# Patient Record
Sex: Female | Born: 1960 | Race: White | Hispanic: No | Marital: Married | Smoking: Never smoker
Health system: Southern US, Community
[De-identification: ages and names within clinical notes are randomized; demographics above are authoritative.]

## PROBLEM LIST (undated history)

## (undated) DIAGNOSIS — M1990 Unspecified osteoarthritis, unspecified site: Secondary | ICD-10-CM

## (undated) DIAGNOSIS — H409 Unspecified glaucoma: Secondary | ICD-10-CM

## (undated) DIAGNOSIS — E559 Vitamin D deficiency, unspecified: Secondary | ICD-10-CM

## (undated) DIAGNOSIS — T8859XA Other complications of anesthesia, initial encounter: Secondary | ICD-10-CM

## (undated) DIAGNOSIS — R112 Nausea with vomiting, unspecified: Secondary | ICD-10-CM

## (undated) DIAGNOSIS — I700 Atherosclerosis of aorta: Secondary | ICD-10-CM

## (undated) DIAGNOSIS — D3500 Benign neoplasm of unspecified adrenal gland: Secondary | ICD-10-CM

## (undated) DIAGNOSIS — E28319 Asymptomatic premature menopause: Secondary | ICD-10-CM

## (undated) DIAGNOSIS — T7840XA Allergy, unspecified, initial encounter: Secondary | ICD-10-CM

## (undated) DIAGNOSIS — Z87442 Personal history of urinary calculi: Secondary | ICD-10-CM

## (undated) DIAGNOSIS — I10 Essential (primary) hypertension: Secondary | ICD-10-CM

## (undated) DIAGNOSIS — K760 Fatty (change of) liver, not elsewhere classified: Secondary | ICD-10-CM

## (undated) DIAGNOSIS — E785 Hyperlipidemia, unspecified: Secondary | ICD-10-CM

## (undated) HISTORY — DX: Vitamin D deficiency, unspecified: E55.9

## (undated) HISTORY — DX: Hyperlipidemia, unspecified: E78.5

## (undated) HISTORY — DX: Atherosclerosis of aorta: I70.0

## (undated) HISTORY — DX: Unspecified osteoarthritis, unspecified site: M19.90

## (undated) HISTORY — PX: WISDOM TOOTH EXTRACTION: SHX21

## (undated) HISTORY — DX: Allergy, unspecified, initial encounter: T78.40XA

## (undated) HISTORY — DX: Personal history of urinary calculi: Z87.442

## (undated) HISTORY — DX: Benign neoplasm of unspecified adrenal gland: D35.00

## (undated) HISTORY — DX: Fatty (change of) liver, not elsewhere classified: K76.0

## (undated) HISTORY — DX: Asymptomatic premature menopause: E28.319

---

## 1997-10-12 ENCOUNTER — Ambulatory Visit (HOSPITAL_COMMUNITY): Admission: RE | Admit: 1997-10-12 | Discharge: 1997-10-12 | Payer: Self-pay | Admitting: Obstetrics and Gynecology

## 1997-11-08 ENCOUNTER — Other Ambulatory Visit: Admission: RE | Admit: 1997-11-08 | Discharge: 1997-11-08 | Payer: Self-pay | Admitting: Obstetrics and Gynecology

## 1998-04-20 ENCOUNTER — Inpatient Hospital Stay (HOSPITAL_COMMUNITY): Admission: AD | Admit: 1998-04-20 | Discharge: 1998-04-23 | Payer: Self-pay | Admitting: Obstetrics and Gynecology

## 1998-05-10 ENCOUNTER — Inpatient Hospital Stay (HOSPITAL_COMMUNITY): Admission: AD | Admit: 1998-05-10 | Discharge: 1998-05-14 | Payer: Self-pay | Admitting: Obstetrics & Gynecology

## 1998-06-24 ENCOUNTER — Other Ambulatory Visit: Admission: RE | Admit: 1998-06-24 | Discharge: 1998-06-24 | Payer: Self-pay | Admitting: Obstetrics and Gynecology

## 1998-07-12 ENCOUNTER — Ambulatory Visit (HOSPITAL_COMMUNITY): Admission: RE | Admit: 1998-07-12 | Discharge: 1998-07-12 | Payer: Self-pay | Admitting: Emergency Medicine

## 1998-07-12 ENCOUNTER — Encounter: Payer: Self-pay | Admitting: Emergency Medicine

## 1998-07-12 ENCOUNTER — Emergency Department (HOSPITAL_COMMUNITY): Admission: EM | Admit: 1998-07-12 | Discharge: 1998-07-12 | Payer: Self-pay | Admitting: Emergency Medicine

## 1998-08-12 HISTORY — PX: CHOLECYSTECTOMY: SHX55

## 1998-08-15 ENCOUNTER — Observation Stay (HOSPITAL_COMMUNITY): Admission: RE | Admit: 1998-08-15 | Discharge: 1998-08-16 | Payer: Self-pay | Admitting: Surgery

## 1999-06-24 ENCOUNTER — Other Ambulatory Visit: Admission: RE | Admit: 1999-06-24 | Discharge: 1999-06-24 | Payer: Self-pay | Admitting: Obstetrics and Gynecology

## 2000-11-04 ENCOUNTER — Other Ambulatory Visit: Admission: RE | Admit: 2000-11-04 | Discharge: 2000-11-04 | Payer: Self-pay | Admitting: Obstetrics and Gynecology

## 2001-12-06 ENCOUNTER — Other Ambulatory Visit: Admission: RE | Admit: 2001-12-06 | Discharge: 2001-12-06 | Payer: Self-pay | Admitting: Obstetrics and Gynecology

## 2002-05-12 ENCOUNTER — Emergency Department (HOSPITAL_COMMUNITY): Admission: EM | Admit: 2002-05-12 | Discharge: 2002-05-12 | Payer: Self-pay | Admitting: Emergency Medicine

## 2003-01-10 ENCOUNTER — Other Ambulatory Visit: Admission: RE | Admit: 2003-01-10 | Discharge: 2003-01-10 | Payer: Self-pay | Admitting: Obstetrics and Gynecology

## 2004-03-18 ENCOUNTER — Other Ambulatory Visit: Admission: RE | Admit: 2004-03-18 | Discharge: 2004-03-18 | Payer: Self-pay | Admitting: Obstetrics and Gynecology

## 2005-07-03 ENCOUNTER — Other Ambulatory Visit: Admission: RE | Admit: 2005-07-03 | Discharge: 2005-07-03 | Payer: Self-pay | Admitting: Obstetrics and Gynecology

## 2005-10-12 ENCOUNTER — Ambulatory Visit: Payer: Self-pay | Admitting: Internal Medicine

## 2006-01-13 ENCOUNTER — Ambulatory Visit: Payer: Self-pay | Admitting: Internal Medicine

## 2006-05-21 ENCOUNTER — Ambulatory Visit: Payer: Self-pay | Admitting: Internal Medicine

## 2006-05-31 ENCOUNTER — Ambulatory Visit: Payer: Self-pay | Admitting: Internal Medicine

## 2006-06-01 ENCOUNTER — Encounter: Payer: Self-pay | Admitting: Internal Medicine

## 2006-06-08 ENCOUNTER — Ambulatory Visit: Payer: Self-pay | Admitting: Internal Medicine

## 2006-11-08 ENCOUNTER — Ambulatory Visit: Payer: Self-pay | Admitting: Internal Medicine

## 2006-11-15 ENCOUNTER — Ambulatory Visit: Payer: Self-pay | Admitting: Internal Medicine

## 2006-11-15 LAB — CONVERTED CEMR LAB
Cholesterol, target level: 200 mg/dL
Cholesterol: 192 mg/dL (ref 0–200)
HDL goal, serum: 40 mg/dL
HDL: 44.8 mg/dL (ref >39.0)
Hgb A1c MFr Bld: 5.1 % (ref 4.6–6.0)
LDL Cholesterol: 116 mg/dL — ABNORMAL HIGH (ref 0–99)
LDL Goal: 160 mg/dL
Total CHOL/HDL Ratio: 4.3
Triglycerides: 155 mg/dL — ABNORMAL HIGH (ref 0–149)
VLDL: 31 mg/dL (ref 0–40)

## 2006-11-18 ENCOUNTER — Encounter: Payer: Self-pay | Admitting: Internal Medicine

## 2006-12-12 ENCOUNTER — Emergency Department (HOSPITAL_COMMUNITY): Admission: EM | Admit: 2006-12-12 | Discharge: 2006-12-13 | Payer: Self-pay | Admitting: Emergency Medicine

## 2006-12-15 ENCOUNTER — Encounter: Payer: Self-pay | Admitting: Internal Medicine

## 2006-12-30 ENCOUNTER — Encounter: Payer: Self-pay | Admitting: Internal Medicine

## 2008-01-05 ENCOUNTER — Ambulatory Visit: Payer: Self-pay | Admitting: Internal Medicine

## 2008-01-05 DIAGNOSIS — Z87442 Personal history of urinary calculi: Secondary | ICD-10-CM

## 2008-01-05 DIAGNOSIS — R9431 Abnormal electrocardiogram [ECG] [EKG]: Secondary | ICD-10-CM

## 2008-01-05 DIAGNOSIS — I10 Essential (primary) hypertension: Secondary | ICD-10-CM | POA: Insufficient documentation

## 2008-01-05 DIAGNOSIS — E785 Hyperlipidemia, unspecified: Secondary | ICD-10-CM

## 2008-01-05 DIAGNOSIS — R5381 Other malaise: Secondary | ICD-10-CM

## 2008-01-05 DIAGNOSIS — R5383 Other fatigue: Secondary | ICD-10-CM

## 2008-01-05 DIAGNOSIS — N959 Unspecified menopausal and perimenopausal disorder: Secondary | ICD-10-CM | POA: Insufficient documentation

## 2008-01-13 ENCOUNTER — Ambulatory Visit: Payer: Self-pay | Admitting: Internal Medicine

## 2008-01-24 ENCOUNTER — Encounter (INDEPENDENT_AMBULATORY_CARE_PROVIDER_SITE_OTHER): Payer: Self-pay | Admitting: *Deleted

## 2008-01-24 LAB — CONVERTED CEMR LAB
ALT: 45 units/L — ABNORMAL HIGH (ref 0–35)
AST: 24 units/L (ref 0–37)
Albumin: 3.9 g/dL (ref 3.5–5.2)
Alkaline Phosphatase: 71 units/L (ref 39–117)
BUN: 9 mg/dL (ref 6–23)
Basophils Absolute: 0 10*3/uL (ref 0.0–0.1)
Basophils Relative: 0.8 % (ref 0.0–3.0)
Bilirubin, Direct: 0.1 mg/dL (ref 0.0–0.3)
CO2: 29 meq/L (ref 19–32)
Calcium: 8.6 mg/dL (ref 8.4–10.5)
Chloride: 102 meq/L (ref 96–112)
Cholesterol: 159 mg/dL (ref 0–200)
Creatinine, Ser: 0.9 mg/dL (ref 0.4–1.2)
Eosinophils Absolute: 0.1 10*3/uL (ref 0.0–0.7)
Eosinophils Relative: 2.7 % (ref 0.0–5.0)
GFR calc Af Amer: 86 mL/min
GFR calc non Af Amer: 71 mL/min
Glucose, Bld: 87 mg/dL (ref 70–99)
HCT: 40.7 % (ref 36.0–46.0)
HDL: 37.2 mg/dL — ABNORMAL LOW (ref >39.0)
Hemoglobin: 13.8 g/dL (ref 12.0–15.0)
LDL Cholesterol: 98 mg/dL (ref 0–99)
Lymphocytes Relative: 50.4 % — ABNORMAL HIGH (ref 12.0–46.0)
MCHC: 33.8 g/dL (ref 30.0–36.0)
MCV: 83.9 fL (ref 78.0–100.0)
Monocytes Absolute: 0.4 10*3/uL (ref 0.1–1.0)
Monocytes Relative: 9.2 % (ref 3.0–12.0)
Neutro Abs: 1.8 10*3/uL (ref 1.4–7.7)
Neutrophils Relative %: 36.9 % — ABNORMAL LOW (ref 43.0–77.0)
Platelets: 218 10*3/uL (ref 150–400)
Potassium: 4.2 meq/L (ref 3.5–5.1)
RBC: 4.85 M/uL (ref 3.87–5.11)
RDW: 11.8 % (ref 11.5–14.6)
Sodium: 138 meq/L (ref 135–145)
TSH: 1.1 microintl units/mL (ref 0.35–5.50)
Total Bilirubin: 0.6 mg/dL (ref 0.3–1.2)
Total CHOL/HDL Ratio: 4.3
Total Protein: 7.4 g/dL (ref 6.0–8.3)
Triglycerides: 118 mg/dL (ref 0–149)
VLDL: 24 mg/dL (ref 0–40)
WBC: 4.8 10*3/uL (ref 4.5–10.5)

## 2008-06-27 ENCOUNTER — Telehealth (INDEPENDENT_AMBULATORY_CARE_PROVIDER_SITE_OTHER): Payer: Self-pay | Admitting: *Deleted

## 2008-10-22 ENCOUNTER — Telehealth (INDEPENDENT_AMBULATORY_CARE_PROVIDER_SITE_OTHER): Payer: Self-pay | Admitting: *Deleted

## 2008-11-13 ENCOUNTER — Ambulatory Visit: Payer: Self-pay | Admitting: Internal Medicine

## 2008-11-13 DIAGNOSIS — R740 Nonspecific elevation of levels of transaminase and lactic acid dehydrogenase [LDH]: Secondary | ICD-10-CM

## 2008-11-13 DIAGNOSIS — IMO0001 Reserved for inherently not codable concepts without codable children: Secondary | ICD-10-CM

## 2008-11-14 ENCOUNTER — Encounter (INDEPENDENT_AMBULATORY_CARE_PROVIDER_SITE_OTHER): Payer: Self-pay | Admitting: *Deleted

## 2008-11-20 ENCOUNTER — Encounter (INDEPENDENT_AMBULATORY_CARE_PROVIDER_SITE_OTHER): Payer: Self-pay | Admitting: *Deleted

## 2008-11-21 ENCOUNTER — Telehealth (INDEPENDENT_AMBULATORY_CARE_PROVIDER_SITE_OTHER): Payer: Self-pay | Admitting: *Deleted

## 2010-01-15 LAB — HM MAMMOGRAPHY

## 2010-01-15 LAB — HM PAP SMEAR

## 2010-05-11 LAB — CONVERTED CEMR LAB
ALT: 55 units/L — ABNORMAL HIGH (ref 0–35)
AST: 31 units/L (ref 0–37)
BUN: 11 mg/dL (ref 6–23)
BUN: 9 mg/dL (ref 6–23)
Cholesterol, target level: 200 mg/dL
Creatinine, Ser: 0.8 mg/dL (ref 0.4–1.2)
Creatinine, Ser: 0.9 mg/dL (ref 0.4–1.2)
Free T4: 0.7 ng/dL (ref 0.6–1.6)
Glucose, Bld: 96 mg/dL (ref 70–99)
HDL goal, serum: 50 mg/dL
Hgb A1c MFr Bld: 5.4 % (ref 4.6–6.5)
LDL Goal: 110 mg/dL
Potassium: 4.1 meq/L (ref 3.5–5.1)
Potassium: 4.3 meq/L (ref 3.5–5.1)
TSH: 1.66 microintl units/mL (ref 0.35–5.50)

## 2010-08-29 NOTE — Op Note (Signed)
[facility]  Patient:    Taylor Holden Visit Number: [id number] MRN: [id number]          Service Type: OBS Location: [id number] Attending Physician:  Earlie Server Proc. Date: 05/11/98 Admit Date:  05/10/1998                             Operative Report  PREOPERATIVE DIAGNOSIS:       Intrauterine pregnancy at 37 weeks.  Spontaneous rupture of membranes.  Subsequent failure to descend.  POSTOPERATIVE DIAGNOSIS:      Intrauterine pregnancy at 37 weeks.  Spontaneous rupture of membranes.  Subsequent failure to descend.  OPERATION:                    Low transverse cesarean section.  SURGEON:                      Darlyn Chamber, M.D.  ASSISTANT:  ANESTHESIA:                   Epidural.  ESTIMATED BLOOD LOSS:         800 cc.  PACKS AND DRAINS:             None.  BLOOD REPLACED:               None.  COMPLICATIONS:                None.  INDICATIONS:                  A 50 year old primigravida married white female presents at 37 weeks with spontaneous rupture of membranes.  Progressed to complete dilatation.  After 2-1/2 hours of pushing, the fetal vertex remained arrested at -1 to 0 station with increasing caput.  It was felt that the outlet was too narrow and the vertex too elevated for proper application of vacuum extraction or forceps.  Therefore, we decided to proceed with primary cesarean section.  The risks were  discussed including the risks of infection, the risk of hemorrhage that could necessitate transfusion and associated risk of AIDS or hepatitis, risk of injury to adjacent organs including bladder, bowel, or ureters that could require further  exploratory surgery, risk of deep venous thrombosis and pulmonary embolism. The patient expressed understanding of indications and risks.  DESCRIPTION OF PROCEDURE:     The patient was taken to the OR and placed in the  supine position with left lateral tilt.  After  satisfactory epidural anesthesia was obtained, the abdomen was prepped with Betadine and draped as a sterile field. A low transverse skin incision was made with the knife and carried through the subcutaneous tissue.  The anterior rectus fascia entered sharply and incision to the fascia extended laterally.  The fascia was taken off of the muscles superiorly and inferiorly using blunt and sharp dissection.  The rectus muscles were separated in the midline.  The peritoneum was entered sharply and incision to peritoneum extended both superiorly and inferiorly.  A low transverse bladder flap was developed.  A low transverse uterine incision was begun with the knife and extended laterally using manual traction.  The infant presented in the vertex presentation and delivered with elevation of the head and fundal pressure.  The infant was a  viable female weighing 6 pounds 5 ounces, Apgars were 8 over 9.  Umbilical artery pH was 0.17.  The placenta was then delivered manually.  The uterus was wiped free of remaining membranes and placenta.  The uterus was closed with a running locking  suture of 0 chromic using a two layer closure technique.  Good hemostasis was noted.  The tubes and ovaries were visualized and noted to be unremarkable. Blood-tinged urine was noted.  We therefore retrofilled the bladder with sterilized milk.  There was no evidence of any leakage and urine was clearing after this. The pelvic cavity was thoroughly irrigated.  Hemostasis was excellent.  Muscles reapproximated with running suture of 3-0 chromic.  The fascia was closed with  running suture of 0 PDS.  The skin was closed with staples and Steri-Strips. Sponge, needle, and instrument counts were correct by circulating nurse x 2. Foley catheter was clear at the time of closure.  The patient tolerated the procedure  well and was returned to the recovery room in good condition. Attending Physician:  Earlie Server DD:  05/11/98 TD:  05/12/98 Job: [id number]

## 2011-01-23 LAB — CBC
HCT: 42.3
MCHC: 33.7
MCV: 83.2
Platelets: 268
RBC: 5.09
WBC: 8.5

## 2011-01-23 LAB — URINALYSIS, ROUTINE W REFLEX MICROSCOPIC
Bilirubin Urine: NEGATIVE
Glucose, UA: NEGATIVE
Ketones, ur: NEGATIVE
Leukocytes, UA: NEGATIVE
Nitrite: NEGATIVE
Protein, ur: NEGATIVE
Specific Gravity, Urine: 1.014
Urobilinogen, UA: 0.2
pH: 8.5 — ABNORMAL HIGH

## 2011-01-23 LAB — DIFFERENTIAL
Basophils Absolute: 0
Basophils Relative: 1
Eosinophils Absolute: 0.1
Eosinophils Relative: 1
Lymphocytes Relative: 19
Lymphs Abs: 1.6
Monocytes Absolute: 0.6
Monocytes Relative: 7
Neutro Abs: 6.2
Neutrophils Relative %: 73

## 2011-01-23 LAB — I-STAT 8, (EC8 V) (CONVERTED LAB)
BUN: 14
Bicarbonate: 29.3 — ABNORMAL HIGH
Glucose, Bld: 126 — ABNORMAL HIGH
TCO2: 31
pCO2, Ven: 44.5 — ABNORMAL LOW

## 2011-01-23 LAB — URINE MICROSCOPIC-ADD ON

## 2011-01-23 LAB — POCT I-STAT CREATININE: Creatinine, Ser: 1.1

## 2011-01-23 LAB — PREGNANCY, URINE: Preg Test, Ur: NEGATIVE

## 2012-09-17 DIAGNOSIS — H409 Unspecified glaucoma: Secondary | ICD-10-CM | POA: Insufficient documentation

## 2012-09-17 DIAGNOSIS — E559 Vitamin D deficiency, unspecified: Secondary | ICD-10-CM | POA: Insufficient documentation

## 2014-01-11 HISTORY — PX: EYE SURGERY: SHX253

## 2014-10-09 ENCOUNTER — Emergency Department (HOSPITAL_BASED_OUTPATIENT_CLINIC_OR_DEPARTMENT_OTHER)
Admission: EM | Admit: 2014-10-09 | Discharge: 2014-10-09 | Disposition: A | Discharge status: Home / Self Care | Payer: BC Managed Care – PPO | Attending: Emergency Medicine | Admitting: Emergency Medicine

## 2014-10-09 ENCOUNTER — Emergency Department (HOSPITAL_BASED_OUTPATIENT_CLINIC_OR_DEPARTMENT_OTHER): Payer: BC Managed Care – PPO

## 2014-10-09 DIAGNOSIS — R110 Nausea: Secondary | ICD-10-CM | POA: Diagnosis not present

## 2014-10-09 DIAGNOSIS — R109 Unspecified abdominal pain: Secondary | ICD-10-CM | POA: Diagnosis not present

## 2014-10-09 DIAGNOSIS — R1031 Right lower quadrant pain: Secondary | ICD-10-CM | POA: Diagnosis present

## 2014-10-09 DIAGNOSIS — R6883 Chills (without fever): Secondary | ICD-10-CM | POA: Insufficient documentation

## 2014-10-09 DIAGNOSIS — M79651 Pain in right thigh: Secondary | ICD-10-CM | POA: Insufficient documentation

## 2014-10-09 DIAGNOSIS — Z3202 Encounter for pregnancy test, result negative: Secondary | ICD-10-CM | POA: Insufficient documentation

## 2014-10-09 DIAGNOSIS — Z79899 Other long term (current) drug therapy: Secondary | ICD-10-CM | POA: Insufficient documentation

## 2014-10-09 DIAGNOSIS — Z87442 Personal history of urinary calculi: Secondary | ICD-10-CM | POA: Insufficient documentation

## 2014-10-09 DIAGNOSIS — M79604 Pain in right leg: Secondary | ICD-10-CM

## 2014-10-09 LAB — BASIC METABOLIC PANEL
ANION GAP: 9 (ref 5–15)
BUN: 14 mg/dL (ref 6–20)
CALCIUM: 9.7 mg/dL (ref 8.9–10.3)
CHLORIDE: 102 mmol/L (ref 101–111)
CO2: 29 mmol/L (ref 22–32)
CREATININE: 0.93 mg/dL (ref 0.44–1.00)
Glucose, Bld: 102 mg/dL — ABNORMAL HIGH (ref 65–99)
Potassium: 4.2 mmol/L (ref 3.5–5.1)
Sodium: 140 mmol/L (ref 135–145)

## 2014-10-09 LAB — CBC WITH DIFFERENTIAL/PLATELET
BASOS ABS: 0 10*3/uL (ref 0.0–0.1)
BASOS PCT: 1 % (ref 0–1)
EOS ABS: 0.2 10*3/uL (ref 0.0–0.7)
Eosinophils Relative: 3 % (ref 0–5)
HEMATOCRIT: 45.2 % (ref 36.0–46.0)
HEMOGLOBIN: 14.9 g/dL (ref 12.0–15.0)
LYMPHS PCT: 30 % (ref 12–46)
Lymphs Abs: 2.6 10*3/uL (ref 0.7–4.0)
MCH: 28.2 pg (ref 26.0–34.0)
MCHC: 33 g/dL (ref 30.0–36.0)
MCV: 85.4 fL (ref 78.0–100.0)
MONO ABS: 1 10*3/uL (ref 0.1–1.0)
MONOS PCT: 12 % (ref 3–12)
NEUTROS ABS: 4.9 10*3/uL (ref 1.7–7.7)
NEUTROS PCT: 56 % (ref 43–77)
Platelets: 261 10*3/uL (ref 150–400)
RBC: 5.29 MIL/uL — AB (ref 3.87–5.11)
RDW: 13.4 % (ref 11.5–15.5)
WBC: 8.8 10*3/uL (ref 4.0–10.5)

## 2014-10-09 LAB — URINALYSIS, ROUTINE W REFLEX MICROSCOPIC
BILIRUBIN URINE: NEGATIVE
GLUCOSE, UA: NEGATIVE mg/dL
HGB URINE DIPSTICK: NEGATIVE
KETONES UR: NEGATIVE mg/dL
Leukocytes, UA: NEGATIVE
Nitrite: NEGATIVE
PH: 5.5 (ref 5.0–8.0)
PROTEIN: NEGATIVE mg/dL
SPECIFIC GRAVITY, URINE: 1.006 (ref 1.005–1.030)
UROBILINOGEN UA: 0.2 mg/dL (ref 0.0–1.0)

## 2014-10-09 LAB — PREGNANCY, URINE: Preg Test, Ur: NEGATIVE

## 2014-10-09 MED ORDER — ONDANSETRON HCL 4 MG/2ML IJ SOLN
4.0000 mg | Freq: Once | INTRAMUSCULAR | Status: AC
  Administered 2014-10-09: 4 mg via INTRAVENOUS
  Filled 2014-10-09: qty 2

## 2014-10-09 MED ORDER — MORPHINE SULFATE 4 MG/ML IJ SOLN
4.0000 mg | Freq: Once | INTRAMUSCULAR | Status: AC
  Administered 2014-10-09: 4 mg via INTRAVENOUS
  Filled 2014-10-09: qty 1

## 2014-10-09 NOTE — ED Notes (Signed)
Patient transported to CT 

## 2014-10-09 NOTE — ED Provider Notes (Signed)
CSN: [id number]     Arrival date & time 10/09/14  1617 History   First MD Initiated Contact with Patient 10/09/14 1625     Chief Complaint  Patient presents with  . Groin Pain     (Consider location/radiation/quality/duration/timing/severity/associated sxs/prior Treatment) Patient is a 54 y.o. female presenting with abdominal pain.  Abdominal Pain Pain location:  R flank (right low back, right inguinal region, right thigh) Pain quality: aching   Pain severity:  Moderate Onset quality:  Gradual Duration:  2 weeks Timing:  Constant Progression:  Worsening Chronicity:  Recurrent Context comment:  Several years ago, had similar symptoms attributed to CT visualized ureteral stone.  Relieved by: heating pad. Exacerbated by: certain movements. Ineffective treatments:  Acetaminophen and NSAIDs Associated symptoms: chills and nausea   Associated symptoms: no chest pain, no dysuria, no fever, no hematuria, no shortness of breath and no vomiting   Associated symptoms comment:  No numbness, tingling, weakness   No past medical history on file. No past surgical history on file. No family history on file. History  Substance Use Topics  . Smoking status: Not on file  . Smokeless tobacco: Not on file  . Alcohol Use: Not on file   OB History    No data available     Review of Systems  Constitutional: Positive for chills. Negative for fever.  Respiratory: Negative for shortness of breath.   Cardiovascular: Negative for chest pain.  Gastrointestinal: Positive for nausea and abdominal pain. Negative for vomiting.  Genitourinary: Negative for dysuria and hematuria.  All other systems reviewed and are negative.     Allergies  Cephalexin and Hydrochlorothiazide  Home Medications   Prior to Admission medications   Medication Sig Start Date End Date Taking? Authorizing Provider  metoprolol tartrate (LOPRESSOR) 25 MG tablet Take 25 mg by mouth 2 (two) times daily.   Yes Historical  Provider, MD  valsartan (DIOVAN) 320 MG tablet Take 320 mg by mouth daily.   Yes Historical Provider, MD   BP 143/76 mmHg  Pulse 83  Temp(Src) 97.7 F (36.5 C) (Oral)  Resp 18  Ht 5\' 4"  (1.626 m)  Wt 200 lb (90.719 kg)  BMI 34.31 kg/m2  SpO2 99% Physical Exam  Constitutional: She is oriented to person, place, and time. She appears well-developed and well-nourished. No distress.  HENT:  Head: Normocephalic and atraumatic.  Eyes: Conjunctivae are normal. No scleral icterus.  Neck: Neck supple.  Cardiovascular: Normal rate and intact distal pulses.   Pulmonary/Chest: Effort normal. No stridor. No respiratory distress.  Abdominal: Normal appearance. She exhibits no distension. There is no tenderness. There is no rigidity, no rebound and no guarding.    Musculoskeletal:       Legs: Neurological: She is alert and oriented to person, place, and time.  Skin: Skin is warm and dry. No rash noted.  Psychiatric: She has a normal mood and affect. Her behavior is normal.  Nursing note and vitals reviewed.   ED Course  Procedures (including critical care time) Labs Review Labs Reviewed  URINALYSIS, ROUTINE W REFLEX MICROSCOPIC (NOT AT [facility]) - Abnormal; Notable for the following:    Color, Urine AMBER (*)    All other components within normal limits  CBC WITH DIFFERENTIAL/PLATELET - Abnormal; Notable for the following:    RBC 5.29 (*)    All other components within normal limits  BASIC METABOLIC PANEL - Abnormal; Notable for the following:    Glucose, Bld 102 (*)    All other  components within normal limits  PREGNANCY, URINE    Imaging Review Ct Renal Stone Study  10/09/2014   CLINICAL DATA:  Right lower flank pain.  Diarrhea.  EXAM: CT ABDOMEN AND PELVIS WITHOUT CONTRAST  TECHNIQUE: Multidetector CT imaging of the abdomen and pelvis was performed following the standard protocol without IV contrast.  COMPARISON:  12/13/2006  FINDINGS: Lower chest: Subsegmental atelectasis in the  posterior basal segments of both lower lobes.  Hepatobiliary: Diffuse hepatic steatosis.  Cholecystectomy.  Pancreas: Unremarkable  Spleen: Unremarkable  Adrenals/Urinary Tract: 1.6 by 1.4 cm right adrenal mass, 3 Hounsfield units density, image 28 series 2. No hydronephrosis or hydroureter.  There are calcifications in the immediate vicinity of the right distal ureter but my suspicion is that these are actually outside the ureter.  Stomach/Bowel: Fatty ileocecal valve.  Vascular/Lymphatic: Mild distal abdominal aortic atherosclerotic calcification.  Reproductive: Unremarkable  Other: No supplemental non-categorized findings.  Musculoskeletal: Lumbar spondylosis and degenerative disc disease, resulting an bilateral foraminal impingement at L5-S1. There is suspected mild to moderate central narrowing of the thecal sac at L4-5.  IMPRESSION: 1. Appendix normal. A specific cause for right flank pain not observed. There are some calcifications in the immediate vicinity of the right distal ureter, but my suspicion is that these are outside of the ureter based on lack of conclusive tissue rim sign and the complete lack of hydroureter proximal to these calcifications. 2. Lumbar spondylosis and degenerative disc disease, resulting in impingement at L5-S1 and probably at L4-5. 3. Mild atherosclerosis. 4. Diffuse hepatic steatosis. 5. Small right adrenal adenoma.   Electronically Signed   By: Van Clines M.D.   On: 10/09/2014 19:53     EKG Interpretation None      MDM   Final diagnoses:  Right flank pain  Right leg pain    She feels symptoms similar to kidney stone in the past.    CT negative for stone, but does show some DDD, which could be source of pain.  She has no lower extremity weakness or numbness, no perineal numbness, no bowel or bladder incontinence, no fevers.  Advised she follow up closely.    Serita Grit, MD 10/09/14 [phone]

## 2014-10-09 NOTE — ED Notes (Signed)
Patient here with right groin pain 2 weeks, reports that it started in her flank and has moved to groin. Hx of stones

## 2014-10-09 NOTE — ED Notes (Signed)
Spoke with MD about pts results and dispo plan.  He will see her asap.

## 2015-09-19 ENCOUNTER — Emergency Department (HOSPITAL_BASED_OUTPATIENT_CLINIC_OR_DEPARTMENT_OTHER)
Admission: EM | Admit: 2015-09-19 | Discharge: 2015-09-19 | Disposition: A | Discharge status: Home / Self Care | Payer: BC Managed Care – PPO | Attending: Emergency Medicine | Admitting: Emergency Medicine

## 2015-09-19 ENCOUNTER — Encounter (HOSPITAL_BASED_OUTPATIENT_CLINIC_OR_DEPARTMENT_OTHER): Payer: Self-pay

## 2015-09-19 DIAGNOSIS — Z79899 Other long term (current) drug therapy: Secondary | ICD-10-CM | POA: Insufficient documentation

## 2015-09-19 DIAGNOSIS — I951 Orthostatic hypotension: Secondary | ICD-10-CM | POA: Insufficient documentation

## 2015-09-19 DIAGNOSIS — E785 Hyperlipidemia, unspecified: Secondary | ICD-10-CM | POA: Insufficient documentation

## 2015-09-19 DIAGNOSIS — I10 Essential (primary) hypertension: Secondary | ICD-10-CM | POA: Insufficient documentation

## 2015-09-19 DIAGNOSIS — T50995A Adverse effect of other drugs, medicaments and biological substances, initial encounter: Secondary | ICD-10-CM | POA: Diagnosis not present

## 2015-09-19 DIAGNOSIS — R42 Dizziness and giddiness: Secondary | ICD-10-CM | POA: Diagnosis present

## 2015-09-19 DIAGNOSIS — Y638 Failure in dosage during other surgical and medical care: Secondary | ICD-10-CM | POA: Insufficient documentation

## 2015-09-19 DIAGNOSIS — T50905A Adverse effect of unspecified drugs, medicaments and biological substances, initial encounter: Secondary | ICD-10-CM

## 2015-09-19 HISTORY — DX: Unspecified glaucoma: H40.9

## 2015-09-19 HISTORY — DX: Essential (primary) hypertension: I10

## 2015-09-19 LAB — COMPREHENSIVE METABOLIC PANEL
ALBUMIN: 4.4 g/dL (ref 3.5–5.0)
ALK PHOS: 98 U/L (ref 38–126)
ALT: 63 U/L — ABNORMAL HIGH (ref 14–54)
ANION GAP: 9 (ref 5–15)
AST: 38 U/L (ref 15–41)
BUN: 15 mg/dL (ref 6–20)
CALCIUM: 9.5 mg/dL (ref 8.9–10.3)
CO2: 28 mmol/L (ref 22–32)
CREATININE: 1.09 mg/dL — AB (ref 0.44–1.00)
Chloride: 99 mmol/L — ABNORMAL LOW (ref 101–111)
GFR calc Af Amer: >60 mL/min (ref >60)
GFR calc non Af Amer: 56 mL/min — ABNORMAL LOW (ref >60)
GLUCOSE: 102 mg/dL — AB (ref 65–99)
Potassium: 3.5 mmol/L (ref 3.5–5.1)
SODIUM: 136 mmol/L (ref 135–145)
Total Bilirubin: 0.7 mg/dL (ref 0.3–1.2)
Total Protein: 7.8 g/dL (ref 6.5–8.1)

## 2015-09-19 LAB — CBC WITH DIFFERENTIAL/PLATELET
BASOS PCT: 0 %
Basophils Absolute: 0 10*3/uL (ref 0.0–0.1)
EOS ABS: 0.2 10*3/uL (ref 0.0–0.7)
EOS PCT: 2 %
HCT: 42.2 % (ref 36.0–46.0)
HEMOGLOBIN: 14.2 g/dL (ref 12.0–15.0)
Lymphocytes Relative: 30 %
Lymphs Abs: 2.9 10*3/uL (ref 0.7–4.0)
MCH: 28 pg (ref 26.0–34.0)
MCHC: 33.6 g/dL (ref 30.0–36.0)
MCV: 83.1 fL (ref 78.0–100.0)
MONOS PCT: 10 %
Monocytes Absolute: 1 10*3/uL (ref 0.1–1.0)
NEUTROS PCT: 58 %
Neutro Abs: 5.6 10*3/uL (ref 1.7–7.7)
PLATELETS: 257 10*3/uL (ref 150–400)
RBC: 5.08 MIL/uL (ref 3.87–5.11)
RDW: 13 % (ref 11.5–15.5)
WBC: 9.7 10*3/uL (ref 4.0–10.5)

## 2015-09-19 NOTE — Discharge Instructions (Signed)
Stop taking Maxide (new diuretic) Check and record blood pressures daily. Follow-up with your primary care physician  Hypotension As your heart beats, it forces blood through your body. This force is called blood pressure. If you have hypotension, you have low blood pressure. When your blood pressure is too low, you may not get enough blood to your brain. You may feel weak, feel lightheaded, have a fast heartbeat, or even pass out (faint). HOME CARE  Drink enough fluids to keep your pee (urine) clear or pale yellow.  Take all medicines as told by your doctor.  Get up slowly after sitting or lying down.  Wear support stockings as told by your doctor.  Maintain a healthy diet by including foods such as fruits, vegetables, nuts, whole grains, and lean meats. GET HELP IF:  You are throwing up (vomiting) or have watery poop (diarrhea).  You have a fever for more than 2-3 days.  You feel more thirsty than usual.  You feel weak and tired. GET HELP RIGHT AWAY IF:   You pass out (faint).  You have chest pain or a fast or irregular heartbeat.  You lose feeling in part of your body.  You cannot move your arms or legs.  You have trouble speaking.  You get sweaty or feel lightheaded. MAKE SURE YOU:   Understand these instructions.  Will watch your condition.  Will get help right away if you are not doing well or get worse.   This information is not intended to replace advice given to you by your health care provider. Make sure you discuss any questions you have with your health care provider.   Document Released: 06/24/2009 Document Revised: 11/30/2012 Document Reviewed: 09/30/2012 Elsevier Interactive Patient Education [employer].

## 2015-09-19 NOTE — ED Notes (Signed)
Pt c/o light headed, nausea- x 2-3 days BP was taken at work-BP low "98/70"-PCP increased BP meds 2 weeks ago-NAD-steady gait

## 2015-09-27 ENCOUNTER — Telehealth: Payer: Self-pay | Admitting: Internal Medicine

## 2015-09-27 NOTE — Telephone Encounter (Signed)
Called in to see if she could be established with Dr. Larose Kells as a new pt? She says that her husband and mother in law  Yoanna Jurczyk and Tene Gato are current pt's.   Please advise for scheduling.    CB: Home number on file.

## 2015-09-27 NOTE — Telephone Encounter (Signed)
Yes , please schedule an appointment at the patient's convenience

## 2015-10-01 NOTE — Telephone Encounter (Signed)
Patient scheduled for 10/02/2015

## 2015-10-02 ENCOUNTER — Encounter: Payer: Self-pay | Admitting: Internal Medicine

## 2015-10-02 ENCOUNTER — Ambulatory Visit (INDEPENDENT_AMBULATORY_CARE_PROVIDER_SITE_OTHER): Payer: BC Managed Care – PPO | Admitting: Internal Medicine

## 2015-10-02 VITALS — BP 124/78 | HR 74 | Temp 98.1°F | Ht 64.0 in | Wt 200.4 lb

## 2015-10-02 DIAGNOSIS — I10 Essential (primary) hypertension: Secondary | ICD-10-CM | POA: Diagnosis not present

## 2015-10-02 DIAGNOSIS — R0683 Snoring: Secondary | ICD-10-CM

## 2015-10-02 DIAGNOSIS — E669 Obesity, unspecified: Secondary | ICD-10-CM | POA: Diagnosis not present

## 2015-10-02 MED ORDER — METOPROLOL SUCCINATE ER 50 MG PO TB24: 50.0000 mg | ORAL_TABLET | Freq: Every day | ORAL | Status: DC

## 2015-10-02 NOTE — Patient Instructions (Signed)
  GO TO THE FRONT DESK Schedule your next appointment for a routine checkup in 6 weeks, fasting.        Check the  blood pressure daily Be sure your blood pressure is between 110/65 and  145/85. If it is consistently higher or lower, let me know

## 2015-10-02 NOTE — Progress Notes (Signed)
Pre visit review using our clinic review tool, if applicable. No additional management support is needed unless otherwise documented below in the visit note. 

## 2015-10-02 NOTE — Progress Notes (Signed)
Subjective:    Patient ID: Taylor Holden, female    DOB: [birth year], 55 y.o.   MRN: [id number]  DOS:  10/02/2015 Type of visit - description : New patient, to get established Interval history: In general feeling well, her main concern however is her blood pressure. Went to his previous Dr. 09/02/2015, BP was in the 150/100, Maxide was added. Few days later, she ended up in the emergency room with low blood pressure, I was unable to open the ER visit note. They recommend to take only valsartan. She did, BP was satisfactory but it  started to increase slowly so she restarted metoprolol ER 25 mg daily, despite that the ambulatory BPs continue to increase to the 140s and even 165/92. Denies taking excessive NSAIDs Admits to stress at work. Has gained 5 pounds in the last few months. Admits to snoring, denies fatigue but she feels is sleeping from time to time.   Review of Systems Denies chest pain or difficulty breathing No nausea, vomiting, diarrhea  Past Medical History  Diagnosis Date  . Hypertension   . Glaucoma   . History of kidney stones   . Hyperlipidemia   . Vitamin D deficiency   . Early menopause   . Allergy     Past Surgical History  Procedure Laterality Date  . Cholecystectomy  08/1998  . Cesarean section  04/1998    x1  . Eye surgery Right 01-2014    for glaucoma    Social History   Social History  . Marital Status: Married    Spouse Name: N/A  . Number of Children: 1  . Years of Education: N/A   Occupational History  . elementary school teacher     Social History Main Topics  . Smoking status: Never Smoker   . Smokeless tobacco: Not on file  . Alcohol Use: No     Comment: rare   . Drug Use: No  . Sexual Activity: Not on file   Other Topics Concern  . Not on file   Social History Narrative     Family History  Problem Relation Age of Onset  . Alzheimer's disease Mother   . Diabetes Father   . Hypertension Father   . Stroke Father    aneurysm   . Diabetes Paternal Grandmother   . Heart disease Paternal Grandmother   . Hypertension Paternal Grandmother   . Colon cancer Neg Hx   . Breast cancer Neg Hx        Medication List       This list is accurate as of: 10/02/15 11:59 PM.  Always use your most recent med list.               latanoprost 0.005 % ophthalmic solution  Commonly known as:  XALATAN  Place 1 drop into both eyes at bedtime.     metoprolol succinate 50 MG 24 hr tablet  Commonly known as:  TOPROL-XL  Take 1 tablet (50 mg total) by mouth daily. Take with or immediately following a meal.     valsartan 320 MG tablet  Commonly known as:  DIOVAN  Take 320 mg by mouth daily.           Objective:   Physical Exam BP 124/78 mmHg  Pulse 74  Temp(Src) 98.1 F (36.7 C) (Oral)  Ht 5\' 4"  (1.626 m)  Wt 200 lb 6 oz (90.89 kg)  BMI 34.38 kg/m2  SpO2 98% General:   Well developed, + central  obesity . NAD.  HEENT:  Normocephalic . Face symmetric, atraumatic Lungs:  CTA B Normal respiratory effort, no intercostal retractions, no accessory muscle use. Heart: RRR,  no murmur.  no pretibial edema bilaterally  Abdomen:  Not distended, soft, non-tender. No rebound or rigidity.  Skin: Not pale. Not jaundice Neurologic:  alert & oriented X3.  Speech normal, gait appropriate for age and unassisted Psych--  Cognition and judgment appear intact.  Cooperative with normal attention span and concentration.  Behavior appropriate. No anxious or depressed appearing.    Assessment & Plan:   Assessment HTN -- rx meds 2001; in the past hctz caused low K Hyperlipidemia Glaucoma  Early menopause, onset 2011 Vitamin D deficiency Allergies H/o  kidney stones  PLAN HTN: On valsartan/metoprolol for a while, BP slightly elevated last month, Maxide was added by a MD, went to the ER June 8 with low blood pressure, Maxide DC. Then was on valsartan only, BPs started to increase and she restarted BB, BP lately  still in the 150, 165/92 at home  (although BP today here is  ok). Plan: Continue losartan, increase  Metoprolol from 25 to 50, monitor BPs. healthy diet, increase exercise  Obesity: + wt gain lately, reports room for improvement on her lifestyle, counseled about diet-exercise. Reassess on RTC Snoring: epworth scale  9, borderline +. We will work on  weight loss and reassess in the future Primary care: Colonoscopy 2014 Dr. Cindee Lame Gynecologist Dr. Shanon Brow lowe RTC 6 weeks, fasting   Today, I spent more than  35  min with the patient: >50% of the time counseling regards diet-exercise-risk of OSA and taking a complex HPI

## 2015-10-03 DIAGNOSIS — R0683 Snoring: Secondary | ICD-10-CM | POA: Insufficient documentation

## 2015-10-03 DIAGNOSIS — Z09 Encounter for follow-up examination after completed treatment for conditions other than malignant neoplasm: Secondary | ICD-10-CM | POA: Insufficient documentation

## 2015-10-03 NOTE — Assessment & Plan Note (Signed)
HTN: On valsartan/metoprolol for a while, BP slightly elevated last month, Maxide was added by a MD, went to the ER June 8 with low blood pressure, Maxide DC. Then was on valsartan only, BPs started to increase and she restarted BB, BP lately still in the 150, 165/92 at home  (although BP today here is  ok). Plan: Continue losartan, increase  Metoprolol from 25 to 50, monitor BPs. healthy diet, increase exercise  Obesity: + wt gain lately, reports room for improvement on her lifestyle, counseled about diet-exercise. Reassess on RTC Snoring: epworth scale  9, borderline +. We will work on  weight loss and reassess in the future Primary care: Colonoscopy 2014 Dr. Cindee Lame Gynecologist Dr. Shanon Brow lowe RTC 6 weeks, fasting

## 2015-10-04 NOTE — ED Provider Notes (Signed)
CSN: [id number]     Arrival date & time 09/19/15  1628 History   First MD Initiated Contact with Patient 09/19/15 1702     Chief Complaint  Patient presents with  . Dizziness      HPI  Patient presents evaluation of lightheadedness last 2-3 days. Placed on a higher dose of her blood pressure medication and admitting 2 weeks ago progressive symptoms since that. No sick BP. No headache. No chest pain. Normal urine output. No vomiting diarrhea or dehydration  Past Medical History  Diagnosis Date  . Hypertension   . Glaucoma   . History of kidney stones   . Hyperlipidemia   . Vitamin D deficiency   . Early menopause   . Allergy    Past Surgical History  Procedure Laterality Date  . Cholecystectomy  08/1998  . Cesarean section  04/1998    x1  . Eye surgery Right 01-2014    for glaucoma   Family History  Problem Relation Age of Onset  . Alzheimer's disease Mother   . Diabetes Father   . Hypertension Father   . Stroke Father     aneurysm   . Diabetes Paternal Grandmother   . Heart disease Paternal Grandmother   . Hypertension Paternal Grandmother   . Colon cancer Neg Hx   . Breast cancer Neg Hx    Social History  Substance Use Topics  . Smoking status: Never Smoker   . Smokeless tobacco: None  . Alcohol Use: No     Comment: rare    OB History    No data available     Review of Systems  Constitutional: Negative for fever, chills, diaphoresis, appetite change and fatigue.  HENT: Negative for mouth sores, sore throat and trouble swallowing.   Eyes: Negative for visual disturbance.  Respiratory: Negative for cough, chest tightness, shortness of breath and wheezing.   Cardiovascular: Negative for chest pain.  Gastrointestinal: Negative for nausea, vomiting, abdominal pain, diarrhea and abdominal distention.  Endocrine: Negative for polydipsia, polyphagia and polyuria.  Genitourinary: Negative for dysuria, frequency and hematuria.  Musculoskeletal: Negative for gait  problem.  Skin: Negative for color change, pallor and rash.  Neurological: Positive for light-headedness. Negative for dizziness, syncope and headaches.  Hematological: Does not bruise/bleed easily.  Psychiatric/Behavioral: Negative for behavioral problems and confusion.      Allergies  Cephalexin and Penicillins  Home Medications   Prior to Admission medications   Medication Sig Start Date End Date Taking? Authorizing Provider  latanoprost (XALATAN) 0.005 % ophthalmic solution Place 1 drop into both eyes at bedtime.    Historical Provider, MD  metoprolol succinate (TOPROL-XL) 50 MG 24 hr tablet Take 1 tablet (50 mg total) by mouth daily. Take with or immediately following a meal. 10/02/15   Wanda Plump, MD  valsartan (DIOVAN) 320 MG tablet Take 320 mg by mouth daily.    Historical Provider, MD   BP 122/78 mmHg  Pulse 61  Temp(Src) 97.6 F (36.4 C) (Oral)  Resp 18  Ht 5\' 4"  (1.626 m)  Wt 190 lb (86.183 kg)  BMI 32.60 kg/m2  SpO2 99% Physical Exam  Constitutional: She is oriented to person, place, and time. She appears well-developed and well-nourished. No distress.  HENT:  Head: Normocephalic.  Eyes: Conjunctivae are normal. Pupils are equal, round, and reactive to light. No scleral icterus.  Neck: Normal range of motion. Neck supple. No thyromegaly present.  Cardiovascular: Normal rate and regular rhythm.  Exam reveals no gallop and  no friction rub.   No murmur heard. Pulmonary/Chest: Effort normal and breath sounds normal. No respiratory distress. She has no wheezes. She has no rales.  Abdominal: Soft. Bowel sounds are normal. She exhibits no distension. There is no tenderness. There is no rebound.  Musculoskeletal: Normal range of motion.  Neurological: She is alert and oriented to person, place, and time.  Skin: Skin is warm and dry. No rash noted.  Psychiatric: She has a normal mood and affect. Her behavior is normal.    ED Course  Procedures (including critical care  time) Labs Review Labs Reviewed  COMPREHENSIVE METABOLIC PANEL - Abnormal; Notable for the following:    Chloride 99 (*)    Glucose, Bld 102 (*)    Creatinine, Ser 1.09 (*)    ALT 63 (*)    GFR calc non Af Amer 56 (*)    All other components within normal limits  CBC WITH DIFFERENTIAL/PLATELET    Imaging Review No results found. I have personally reviewed and evaluated these images and lab results as part of my medical decision-making.   EKG Interpretation   Date/Time:  Thursday September 19 2015 16:46:10 EDT Ventricular Rate:  66 PR Interval:  126 QRS Duration: 102 QT Interval:  360 QTC Calculation: 377 R Axis:   16 Text Interpretation:  Normal sinus rhythm Nonspecific T wave abnormality  Abnormal ECG Confirmed by Jeneen Rinks  MD, [place] ([id number]) on 09/19/2015 5:34:02 PM      MDM   Final diagnoses:  Hypotension, postural  Medication side effects, initial encounter    Blood pressures remained stable. Reassuring labs and studies. Patient can discontinue her Maxide. Primary care follow-up.    Tanna Furry, MD 10/04/15 [phone]

## 2015-11-13 ENCOUNTER — Encounter: Payer: Self-pay | Admitting: Internal Medicine

## 2015-11-13 ENCOUNTER — Ambulatory Visit (INDEPENDENT_AMBULATORY_CARE_PROVIDER_SITE_OTHER): Payer: BC Managed Care – PPO | Admitting: Internal Medicine

## 2015-11-13 VITALS — BP 128/82 | HR 75 | Temp 98.5°F | Resp 12 | Ht 64.0 in | Wt 199.2 lb

## 2015-11-13 DIAGNOSIS — E669 Obesity, unspecified: Secondary | ICD-10-CM

## 2015-11-13 DIAGNOSIS — I10 Essential (primary) hypertension: Secondary | ICD-10-CM | POA: Diagnosis not present

## 2015-11-13 LAB — BASIC METABOLIC PANEL
BUN: 13 mg/dL (ref 6–23)
CHLORIDE: 104 meq/L (ref 96–112)
CO2: 24 meq/L (ref 19–32)
CREATININE: 1.07 mg/dL (ref 0.40–1.20)
Calcium: 9.6 mg/dL (ref 8.4–10.5)
GFR: 56.48 mL/min — ABNORMAL LOW (ref >60.00)
Glucose, Bld: 105 mg/dL — ABNORMAL HIGH (ref 70–99)
POTASSIUM: 4.3 meq/L (ref 3.5–5.1)
Sodium: 138 mEq/L (ref 135–145)

## 2015-11-13 LAB — TSH: TSH: 2.39 u[IU]/mL (ref 0.35–4.50)

## 2015-11-13 MED ORDER — METOPROLOL SUCCINATE ER 50 MG PO TB24: 50.0000 mg | ORAL_TABLET | Freq: Every day | ORAL | 9 refills | Status: DC

## 2015-11-13 MED ORDER — VALSARTAN 320 MG PO TABS: 320.0000 mg | ORAL_TABLET | Freq: Every day | ORAL | 9 refills | Status: DC

## 2015-11-13 NOTE — Patient Instructions (Signed)
GO TO THE LAB : Get the blood work     GO TO THE FRONT DESK Schedule your next appointment for a  Check up in 6 months     Check the  blood pressure  weekly , use different machines. Be sure your blood pressure is between 110/65 and  140/85. If it is consistently higher or lower, let me know  Okay to increase metoprolol to a full dose however if that does not sit well with you --- let  me know.

## 2015-11-13 NOTE — Assessment & Plan Note (Signed)
HTN: Currently on valsartan and metoprolol (self decreased to half tablet daily). Ambulatory BPs occasionally elevated, BP at the office are great. Continue current medications, okay to increase metoprolol to a full tablet again if BP is slt  elevated in the outpatient setting, encouraged to try different BP machines (her own may be inaccurate). Check BMP, TSH. Refill valsartan Obesity: Doing better with diet and exercise. Praised! RTC  6 months

## 2015-11-13 NOTE — Progress Notes (Signed)
Pre visit review using our clinic review tool, if applicable. No additional management support is needed unless otherwise documented below in the visit note. 

## 2015-11-13 NOTE — Progress Notes (Signed)
Subjective:    Patient ID: Taylor Holden, female    DOB: 31-Dec-1960, 55 y.o.   MRN: [id number]  DOS:  11/13/2015 Type of visit - description : f/u Interval history: Feeling well, Doing better with diet, exercise almost daily, walking 30 minutes. Ambulatory BPs: 130, [id number] with diastolic in the [id number], low 0000000 Self decreased metoprolol to half tablet daily, with a full tablet she was feeling slightly dizzy and lightheaded.   Wt Readings from Last 3 Encounters:  11/13/15 199 lb 4 oz (90.4 kg)  10/02/15 200 lb 6 oz (90.9 kg)  09/19/15 190 lb (86.2 kg)    Review of Systems  No chest pain, difficulty breathing. No shortness of breath or edema  Past Medical History:  Diagnosis Date  . Allergy   . Early menopause   . Glaucoma   . History of kidney stones   . Hyperlipidemia   . Hypertension   . Vitamin D deficiency     Past Surgical History:  Procedure Laterality Date  . CESAREAN SECTION  04/1998   x1  . CHOLECYSTECTOMY  08/1998  . EYE SURGERY Right 01-2014   for glaucoma    Social History   Social History  . Marital status: Married    Spouse name: N/A  . Number of children: 1  . Years of education: N/A   Occupational History  . elementary school teacher     Social History Main Topics  . Smoking status: Never Smoker  . Smokeless tobacco: Never Used  . Alcohol use No     Comment: rare   . Drug use: No  . Sexual activity: Not on file   Other Topics Concern  . Not on file   Social History Narrative  . No narrative on file        Medication List       Accurate as of 11/13/15  1:35 PM. Always use your most recent med list.          latanoprost 0.005 % ophthalmic solution Commonly known as:  XALATAN Place 1 drop into both eyes at bedtime.   metoprolol succinate 50 MG 24 hr tablet Commonly known as:  TOPROL-XL Take 1 tablet (50 mg total) by mouth daily. Take with or immediately following a meal.   valsartan 320 MG tablet Commonly known as:  DIOVAN Take  1 tablet (320 mg total) by mouth daily.          Objective:   Physical Exam BP 128/82 (BP Location: Left Arm, Patient Position: Sitting, Cuff Size: Normal)   Pulse 75   Temp 98.5 F (36.9 C) (Oral)   Resp 12   Ht 5\' 4"  (1.626 m)   Wt 199 lb 4 oz (90.4 kg)   SpO2 98%   BMI 34.20 kg/m  General:   Well developed, well nourished . NAD.  HEENT:  Normocephalic . Face symmetric, atraumatic Lungs:  CTA B Normal respiratory effort, no intercostal retractions, no accessory muscle use. Heart: RRR,  no murmur.  No pretibial edema bilaterally  Skin: Not pale. Not jaundice Neurologic:  alert & oriented X3.  Speech normal, gait appropriate for age and unassisted Psych--  Cognition and judgment appear intact.  Cooperative with normal attention span and concentration.  Behavior appropriate. No anxious or depressed appearing.      Assessment & Plan:    Assessment New pt to me 10-02-15 HTN -- rx meds 2001; in the past hctz caused low K Hyperlipidemia Glaucoma  Early menopause, onset  2011 Vitamin D deficiency Allergies H/o  kidney stones  PLAN HTN: Currently on valsartan and metoprolol (self decreased to half tablet daily). Ambulatory BPs occasionally elevated, BP at the office are great. Continue current medications, okay to increase metoprolol to a full tablet again if BP is slt  elevated in the outpatient setting, encouraged to try different BP machines (her own may be inaccurate). Check BMP, TSH. Refill valsartan Obesity: Doing better with diet and exercise. Praised! RTC  6 months

## 2016-05-15 ENCOUNTER — Ambulatory Visit (INDEPENDENT_AMBULATORY_CARE_PROVIDER_SITE_OTHER): Payer: BC Managed Care – PPO | Admitting: Internal Medicine

## 2016-05-15 ENCOUNTER — Encounter: Payer: Self-pay | Admitting: Internal Medicine

## 2016-05-15 VITALS — BP 132/78 | HR 80 | Temp 97.8°F | Resp 14 | Ht 64.0 in | Wt 208.0 lb

## 2016-05-15 DIAGNOSIS — Z0000 Encounter for general adult medical examination without abnormal findings: Secondary | ICD-10-CM | POA: Insufficient documentation

## 2016-05-15 DIAGNOSIS — E785 Hyperlipidemia, unspecified: Secondary | ICD-10-CM | POA: Diagnosis not present

## 2016-05-15 DIAGNOSIS — M159 Polyosteoarthritis, unspecified: Secondary | ICD-10-CM

## 2016-05-15 DIAGNOSIS — E6609 Other obesity due to excess calories: Secondary | ICD-10-CM

## 2016-05-15 DIAGNOSIS — I10 Essential (primary) hypertension: Secondary | ICD-10-CM

## 2016-05-15 DIAGNOSIS — M150 Primary generalized (osteo)arthritis: Secondary | ICD-10-CM

## 2016-05-15 DIAGNOSIS — Z6835 Body mass index (BMI) 35.0-35.9, adult: Secondary | ICD-10-CM

## 2016-05-15 LAB — BASIC METABOLIC PANEL
BUN: 14 mg/dL (ref 7–25)
CALCIUM: 9.4 mg/dL (ref 8.6–10.4)
CO2: 26 mmol/L (ref 20–31)
CREATININE: 0.99 mg/dL (ref 0.50–1.05)
Chloride: 104 mmol/L (ref 98–110)
Glucose, Bld: 92 mg/dL (ref 65–99)
Potassium: 3.9 mmol/L (ref 3.5–5.3)
SODIUM: 140 mmol/L (ref 135–146)

## 2016-05-15 LAB — LIPID PANEL
Cholesterol: 198 mg/dL (ref <200)
HDL: 59 mg/dL (ref >50)
LDL Cholesterol: 117 mg/dL — ABNORMAL HIGH (ref <100)
Total CHOL/HDL Ratio: 3.4 Ratio (ref <5.0)
Triglycerides: 111 mg/dL (ref <150)
VLDL: 22 mg/dL (ref <30)

## 2016-05-15 LAB — AST: AST: 59 U/L — AB (ref 10–35)

## 2016-05-15 LAB — ALT: ALT: 87 U/L — ABNORMAL HIGH (ref 6–29)

## 2016-05-15 NOTE — Progress Notes (Signed)
Pre visit review using our clinic review tool, if applicable. No additional management support is needed unless otherwise documented below in the visit note. 

## 2016-05-15 NOTE — Patient Instructions (Signed)
GO TO THE LAB : Get the blood work     GO TO THE FRONT DESK Schedule your next appointment for a  physical exam in 6 months, fasting    Check the  blood pressure 2 or 3 times a month Be sure your blood pressure is between 110/65 and  145/85. If it is consistently higher or lower, let me know   Stay active  IBUPROFEN (Advil or Motrin) 200 mg 2 tablets every 6 hours as needed for pain.  Always take it with food because may cause gastritis and ulcers.  If you notice nausea, stomach pain, change in the color of stools --->  Stop the medicine and let us know   [place] with information about home physical therapy for back pain: [email or web address]

## 2016-05-15 NOTE — Assessment & Plan Note (Signed)
Td 2012. Had a flu shot. Interested on a shingles shot--will wait until shingrix is available. Female care-- Dr Corinna Capra RTC 6 months for a CPX

## 2016-05-15 NOTE — Progress Notes (Signed)
Subjective:    Patient ID: Taylor Holden, female    DOB: [birth year], 56 y.o.   MRN: [id number]  DOS:  05/15/2016 Type of visit - description : rov Interval history: HTN: Good compliance of medication, BP was low a couple of times last year but lately is in the 130s over 80s. DJD: Long history of aches and pains in different places: Knees, ankles, hips. . The last 2 years the right hip has been bothering her  the most. Usually Motrin helps; pain is located at the buttock, also has some radiation anteriorly and she wonders if it could be her back. Obesity, room for improvement on her diet and exercise  Wt Readings from Last 3 Encounters:  05/15/16 208 lb (94.3 kg)  11/13/15 199 lb 4 oz (90.4 kg)  10/02/15 200 lb 6 oz (90.9 kg)     Review of Systems No redness, swelling or puffiness in the joints No lower extremity paresthesias. No lower extremity edema  Past Medical History:  Diagnosis Date  . Allergy   . Early menopause   . Glaucoma   . History of kidney stones   . Hyperlipidemia   . Hypertension   . Vitamin D deficiency     Past Surgical History:  Procedure Laterality Date  . CESAREAN SECTION  04/1998   x1  . CHOLECYSTECTOMY  08/1998  . EYE SURGERY Right 01-2014   for glaucoma    Social History   Social History  . Marital status: Married    Spouse name: N/A  . Number of children: 1  . Years of education: N/A   Occupational History  . elementary school teacher     Social History Main Topics  . Smoking status: Never Smoker  . Smokeless tobacco: Never Used  . Alcohol use No     Comment: rare   . Drug use: No  . Sexual activity: Not on file   Other Topics Concern  . Not on file   Social History Narrative  . No narrative on file      Allergies as of 05/15/2016      Reactions   Cephalexin Anaphylaxis   Penicillins Hives      Medication List       Accurate as of 05/15/16 11:59 PM. Always use your most recent med list.          latanoprost 0.005  % ophthalmic solution Commonly known as:  XALATAN Place 1 drop into both eyes at bedtime.   metoprolol succinate 50 MG 24 hr tablet Commonly known as:  TOPROL-XL Take 1 tablet (50 mg total) by mouth daily. Take with or immediately following a meal.   valsartan 320 MG tablet Commonly known as:  DIOVAN Take 1 tablet (320 mg total) by mouth daily.          Objective:   Physical Exam  Musculoskeletal:       Legs:  BP 132/78 (BP Location: Left Arm, Patient Position: Sitting, Cuff Size: Normal)   Pulse 80   Temp 97.8 F (36.6 C) (Oral)   Resp 14   Ht 5\' 4"  (1.626 m)   Wt 208 lb (94.3 kg)   SpO2 98%   BMI 35.70 kg/m  General:   Well developed, well nourished . NAD.  HEENT:  Normocephalic . Face symmetric, atraumatic Lungs:  CTA B Normal respiratory effort, no intercostal retractions, no accessory muscle use. Heart: RRR,  no murmur.  No pretibial edema bilaterally  Skin: Not pale. Not jaundice  MSK: (Escorted by my one of my nurses) Hips: Range of motion normal, straight leg test negative. Palpation of the trochanteric bursitis: Minimal tenderness on the right, normal on the left. Neurologic:  alert & oriented X3.  Speech normal, gait appropriate for age and unassisted. DTRs symmetric Psych--  Cognition and judgment appear intact.  Cooperative with normal attention span and concentration.  Behavior appropriate. No anxious or depressed appearing.      Assessment & Plan:     Assessment  New pt to me 10-02-15 HTN -- rx meds 2001; in the past hctz caused low K Obesity Hyperlipidemia Glaucoma  Early menopause, onset 2011 Vitamin D deficiency Allergies H/o  kidney stones  PLAN HTN: Well controlled on Toprol and Diovan. Check a BMP. Dyslipidemia: Diet control, check a FLP, AST, ALT (nonfasting) Obesity: Diet and exercise discussed DJD: Reports on and off chronic pain at large joints, see HPI Most likely she has some degree of DJD. The pain at the right  back/buttock could be hip or back related. For now recommend stretching, stay active, self physical therapy, see instructions. Also take Motrin, GI side effects discussed. At some point may need to see a specialist. RTC 6 months, CPX

## 2016-05-17 DIAGNOSIS — M1990 Unspecified osteoarthritis, unspecified site: Secondary | ICD-10-CM | POA: Insufficient documentation

## 2016-05-17 NOTE — Assessment & Plan Note (Signed)
HTN: Well controlled on Toprol and Diovan. Check a BMP. Dyslipidemia: Diet control, check a FLP, AST, ALT (nonfasting) Obesity: Diet and exercise discussed DJD: Reports on and off chronic pain at large joints, see HPI Most likely she has some degree of DJD. The pain at the right back/buttock could be hip or back related. For now recommend stretching, stay active, self physical therapy, see instructions. Also take Motrin, GI side effects discussed. At some point may need to see a specialist. RTC 6 months, CPX

## 2016-06-17 ENCOUNTER — Encounter: Payer: Self-pay | Admitting: Internal Medicine

## 2016-06-17 ENCOUNTER — Ambulatory Visit (INDEPENDENT_AMBULATORY_CARE_PROVIDER_SITE_OTHER): Payer: BC Managed Care – PPO | Admitting: Internal Medicine

## 2016-06-17 VITALS — BP 134/78 | HR 69 | Temp 97.8°F | Resp 14 | Ht 64.0 in | Wt 206.2 lb

## 2016-06-17 DIAGNOSIS — R002 Palpitations: Secondary | ICD-10-CM | POA: Diagnosis not present

## 2016-06-17 NOTE — Progress Notes (Signed)
Subjective:    Patient ID: Taylor Holden, female    DOB: 24-Mar-1961, 56 y.o.   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 All sxs lasted 5-10 minutes and spontaneously resolved. There was no associated faint feeling, nausea, diaphoresis. Her BP was checked twice,was elevated at around 140/100, 138/100. Declined  to call 911 and came here for further eval. At this point, she is back to baseline. BPs are checked home regularly and they are usually 120/80.  Review of Systems  Denies chest pain. No cough or wheezing. No edema No recent change of her medications, no dietary indiscretions with salt No recent prolonged car trips or airplane trips. No calf edema but had some pain there B. Denies any stroke symptoms such as diplopia, slurred speech, motor deficits or facial numbness. Stress at work slightly worse than usual.  Past Medical History:  Diagnosis Date  . Allergy   . Early menopause   . Glaucoma   . History of kidney stones   . Hyperlipidemia   . Hypertension   . Vitamin D deficiency     Past Surgical History:  Procedure Laterality Date  . CESAREAN SECTION  04/1998   x1  . CHOLECYSTECTOMY  08/1998  . EYE SURGERY Right 01-2014   for glaucoma    Social History   Social History  . Marital status: Married    Spouse name: N/A  . Number of children: 1  . Years of education: N/A   Occupational History  . elementary school teacher     Social History Main Topics  . Smoking status: Never Smoker  . Smokeless tobacco: Never Used  . Alcohol use No     Comment: rare   . Drug use: No  . Sexual activity: Not on file   Other Topics Concern  . Not on file   Social History Narrative  . No narrative on file      Allergies as of 06/17/2016    Reactions   Cephalexin Anaphylaxis   Penicillins Hives      Medication List       Accurate as of 06/17/16 11:59 PM. Always use your most recent med list.          latanoprost 0.005 % ophthalmic solution Commonly known as:  XALATAN Place 1 drop into both eyes at bedtime.   metoprolol succinate 50 MG 24 hr tablet Commonly known as:  TOPROL-XL Take 1 tablet (50 mg total) by mouth daily. Take with or immediately following a meal.   valsartan 320 MG tablet Commonly known as:  DIOVAN Take 1 tablet (320 mg total) by mouth daily.          Objective:   Physical Exam BP 134/78 (BP Location: Left Arm, Patient Position: Sitting, Cuff Size: Normal)   Pulse 69   Temp 97.8 F (36.6 C) (Oral)   Resp 14   Ht 5\' 4"  (1.626 m)   Wt 206 lb 4 oz (93.6 kg)   SpO2 97%   BMI 35.40 kg/m  General:   Well developed, well nourished . NAD.  HEENT:  Normocephalic . Face symmetric, atraumatic Neck: Full range of motion, normal carotid arteries Lungs:  CTA B Normal respiratory effort, no intercostal retractions, no accessory muscle use. Heart: RRR,  no  murmur.  no pretibial edema bilaterally . Calves symmetric, no TTP. Abdomen:  Not distended, soft, non-tender. No rebound or rigidity.  Skin: Not pale. Not jaundice Neurologic:  alert & oriented X3.  Speech normal, gait appropriate for age and unassisted . EOMI, motor symmetric, face symmetric. Psych--  Cognition and judgment appear intact.  Cooperative with normal attention span and concentration.  Behavior appropriate. No anxious or depressed appearing.    Assessment & Plan:    Assessment  New pt to me 10-02-15 HTN -- rx meds 2001; in the past hctz caused low K Obesity Hyperlipidemia Mildly elevated LFTs Glaucoma  Early menopause, onset 2011 Vitamin D deficiency Allergies H/o  kidney stones  PLAN Palpitations: Transient episode of palpitation, feeling unfocused, funny breathing. BP was elevated, now back to normal.  EKG  today: Normal sinus rhythm, PR slightly short. Clinical grounds, no strong evidence to suggest CAD, stroke, PE. Plan: Rest, observation, monitor BP.  Will discuss EKG with DOD. (Addendum: No evidence of preexcitation, set up monitory if she has more tachypalpitations) ER if symptoms resurface. Patient and husband in agreement, at the end, she felt that symptoms were probably related to anxiety/stress.

## 2016-06-17 NOTE — Progress Notes (Signed)
Pre visit review using our clinic review tool, if applicable. No additional management support is needed unless otherwise documented below in the visit note. 

## 2016-06-17 NOTE — Patient Instructions (Signed)
Rest, drink plenty of fluids  Continue same medications  Check your blood pressure daily for the next 10 days  ER if symptoms resurface

## 2016-06-26 NOTE — Assessment & Plan Note (Signed)
Palpitations: Transient episode of palpitation, feeling unfocused, funny breathing. BP was elevated, now back to normal.  EKG today: Normal sinus rhythm, PR slightly short. Clinical grounds, no strong evidence to suggest CAD, stroke, PE. Plan: Rest, observation, monitor BP.  Will discuss EKG with DOD. (Addendum: No evidence of preexcitation, set up monitory if she has more tachypalpitations) ER if symptoms resurface. Patient and husband in agreement, at the end, she felt that symptoms were probably related to anxiety/stress.

## 2016-09-19 ENCOUNTER — Other Ambulatory Visit: Payer: Self-pay | Admitting: Internal Medicine

## 2016-11-12 ENCOUNTER — Encounter: Payer: Self-pay | Admitting: Internal Medicine

## 2016-11-12 ENCOUNTER — Ambulatory Visit (INDEPENDENT_AMBULATORY_CARE_PROVIDER_SITE_OTHER): Payer: BC Managed Care – PPO | Admitting: Internal Medicine

## 2016-11-12 VITALS — BP 128/80 | HR 76 | Temp 97.6°F | Resp 14 | Ht 64.0 in | Wt 207.4 lb

## 2016-11-12 DIAGNOSIS — Z1159 Encounter for screening for other viral diseases: Secondary | ICD-10-CM

## 2016-11-12 DIAGNOSIS — Z114 Encounter for screening for human immunodeficiency virus [HIV]: Secondary | ICD-10-CM

## 2016-11-12 DIAGNOSIS — R768 Other specified abnormal immunological findings in serum: Secondary | ICD-10-CM

## 2016-11-12 DIAGNOSIS — R945 Abnormal results of liver function studies: Secondary | ICD-10-CM

## 2016-11-12 DIAGNOSIS — I10 Essential (primary) hypertension: Secondary | ICD-10-CM

## 2016-11-12 DIAGNOSIS — Z0000 Encounter for general adult medical examination without abnormal findings: Secondary | ICD-10-CM | POA: Diagnosis not present

## 2016-11-12 DIAGNOSIS — R7989 Other specified abnormal findings of blood chemistry: Secondary | ICD-10-CM

## 2016-11-12 LAB — HEPATIC FUNCTION PANEL
ALBUMIN: 4 g/dL (ref 3.5–5.2)
ALK PHOS: 99 U/L (ref 39–117)
ALT: 111 U/L — ABNORMAL HIGH (ref 0–35)
AST: 76 U/L — AB (ref 0–37)
BILIRUBIN DIRECT: 0.1 mg/dL (ref 0.0–0.3)
Total Bilirubin: 0.8 mg/dL (ref 0.2–1.2)
Total Protein: 7.3 g/dL (ref 6.0–8.3)

## 2016-11-12 LAB — CBC WITH DIFFERENTIAL/PLATELET
BASOS PCT: 0.7 % (ref 0.0–3.0)
Basophils Absolute: 0 10*3/uL (ref 0.0–0.1)
Eosinophils Absolute: 0.1 10*3/uL (ref 0.0–0.7)
Eosinophils Relative: 1.6 % (ref 0.0–5.0)
HCT: 45.6 % (ref 36.0–46.0)
Hemoglobin: 15 g/dL (ref 12.0–15.0)
LYMPHS ABS: 1.5 10*3/uL (ref 0.7–4.0)
Lymphocytes Relative: 26.6 % (ref 12.0–46.0)
MCHC: 32.8 g/dL (ref 30.0–36.0)
MCV: 87.2 fl (ref 78.0–100.0)
MONO ABS: 0.4 10*3/uL (ref 0.1–1.0)
Monocytes Relative: 7.9 % (ref 3.0–12.0)
NEUTROS ABS: 3.6 10*3/uL (ref 1.4–7.7)
NEUTROS PCT: 63.2 % (ref 43.0–77.0)
PLATELETS: 232 10*3/uL (ref 150.0–400.0)
RBC: 5.23 Mil/uL — AB (ref 3.87–5.11)
RDW: 13.1 % (ref 11.5–15.5)
WBC: 5.6 10*3/uL (ref 4.0–10.5)

## 2016-11-12 MED ORDER — LOSARTAN POTASSIUM 100 MG PO TABS: 100.0000 mg | ORAL_TABLET | Freq: Every day | ORAL | 6 refills | Status: DC

## 2016-11-12 NOTE — Assessment & Plan Note (Signed)
HTN: Seems well-controlled on metoprolol and valsartan. She has a few weeks supply of valsartan, after she finished will switch to losartan 100 mg. Two  weeks after she switched will check a BMP. Recommend to monitor her blood pressures. Obesity: Diet and exercise discussed Hyperlipidemia: Last FLP satisfactory Mildly elevated LFTs: Chronic, checking hepatitis B and C serologies. Also AMA, ANA, Transferrin sat, ceruloplasmin,  smooth muscle antibody Vitamin D deficiency: Check labs. RTC 6 months

## 2016-11-12 NOTE — Progress Notes (Signed)
Subjective:    Patient ID: Taylor Holden, female    DOB: 10-10-1960, 56 y.o.   MRN: [id number]  DOS:  11/12/2016 Type of visit - description : cpx  Interval history: In general doing well. Lost a family member, Taylor Holden few months ago, she was very sad, feeling a little better now. Has right hip pain, on and off, no radiation, not affecting her lifestyle.   Review of Systems   Other than above, a 14 point review of systems is negative    Past Medical History:  Diagnosis Date  . Allergy   . Early menopause   . Glaucoma   . History of kidney stones   . Hyperlipidemia   . Hypertension   . Vitamin D deficiency     Past Surgical History:  Procedure Laterality Date  . CESAREAN SECTION  04/1998   x1  . CHOLECYSTECTOMY  08/1998  . EYE SURGERY Right 01-2014   for glaucoma    Social History   Social History  . Marital status: Married    Spouse name: N/A  . Number of children: 1  . Years of education: N/A   Occupational History  . elementary school teacher , retiring 2019    Social History Main Topics  . Smoking status: Never Smoker  . Smokeless tobacco: Never Used  . Alcohol use No     Comment: rare   . Drug use: No  . Sexual activity: Not on file   Other Topics Concern  . Not on file   Social History Narrative   Household: pt, husband, son     Family History  Problem Relation Age of Onset  . Alzheimer's disease Mother   . Diabetes Father   . Hypertension Father   . Stroke Father        aneurysm   . Diabetes Paternal Grandmother   . Heart disease Paternal Grandmother   . Hypertension Paternal Grandmother   . Colon cancer Neg Hx   . Breast cancer Neg Hx      Allergies as of 11/12/2016      Reactions   Cephalexin Anaphylaxis   Penicillins Hives      Medication List       Accurate as of 11/12/16  8:33 PM. Always use your most recent med list.          latanoprost 0.005 % ophthalmic solution Commonly known as:  XALATAN Place 1 drop  into both eyes at bedtime.   losartan 100 MG tablet Commonly known as:  COZAAR Take 1 tablet (100 mg total) by mouth daily.   metoprolol succinate 50 MG 24 hr tablet Commonly known as:  TOPROL-XL Take 1 tablet (50 mg total) by mouth daily. Take with or immediately following a meal.          Objective:   Physical Exam BP 128/80 (BP Location: Left Arm, Patient Position: Sitting, Cuff Size: Small)   Pulse 76   Temp 97.6 F (36.4 C) (Oral)   Resp 14   Ht 5\' 4"  (1.626 m)   Wt 207 lb 6 oz (94.1 kg)   SpO2 97%   BMI 35.60 kg/m   General:   Well developed, obese appearing . NAD.  Neck: No  thyromegaly  HEENT:  Normocephalic . Face symmetric, atraumatic Lungs:  CTA B Normal respiratory effort, no intercostal retractions, no accessory muscle use. Heart: RRR,  no murmur.  No pretibial edema bilaterally  Abdomen:  Not distended, soft, non-tender. No rebound  or rigidity.   Skin: Exposed areas without rash. Not pale. Not jaundice Neurologic:  alert & oriented X3.  Speech normal, gait appropriate for age and unassisted Strength symmetric and appropriate for age.  Psych: Cognition and judgment appear intact.  Cooperative with normal attention span and concentration.  Behavior appropriate. No anxious or depressed appearing.    Assessment & Plan:   Assessment  New pt to me 10-02-15 HTN -- rx meds 2001; in the past hctz caused low K Obesity Hyperlipidemia Mildly elevated LFTs Glaucoma  Early menopause, onset 2011 Vitamin D deficiency Allergies H/o  kidney stones  PLAN HTN: Seems well-controlled on metoprolol and valsartan. She has a few weeks supply of valsartan, after she finished will switch to losartan 100 mg. Two  weeks after she switched will check a BMP. Recommend to monitor her blood pressures. Obesity: Diet and exercise discussed Hyperlipidemia: Last FLP satisfactory Mildly elevated LFTs: Chronic, checking hepatitis B and C serologies. Also AMA, ANA, Transferrin  sat, ceruloplasmin,  smooth muscle antibody Vitamin D deficiency: Check labs. RTC 6 months   3---- Palpitations: Transient episode of palpitation, feeling unfocused, funny breathing. BP was elevated, now back to normal.  EKG today: Normal sinus rhythm, PR slightly short. Clinical grounds, no strong evidence to suggest CAD, stroke, PE. Plan: Rest, observation, monitor BP.  Will discuss EKG with DOD. (Addendum: No evidence of preexcitation, set up monitory if she has more tachypalpitations) ER if symptoms resurface. Patient and husband in agreement, at the end, she felt that symptoms were probably related to anxiety/stress.

## 2016-11-12 NOTE — Assessment & Plan Note (Addendum)
-  Td 2012. Interested on a shingles shot: will wait until shingrix is available. -Female care: Dr Corinna Capra, reports regular MMG and had a DEXA (no documentation) -CCS: [facility] 2014 in [place] (@[address] Will try to get records -Labs reviewed. Will get the CBC, LFTs, HIV, hep C, hepatitis B serology, vitamin D. -diet and exercise discussed

## 2016-11-12 NOTE — Progress Notes (Signed)
Pre visit review using our clinic review tool, if applicable. No additional management support is needed unless otherwise documented below in the visit note. 

## 2016-11-12 NOTE — Patient Instructions (Signed)
GO TO THE LAB : Get the blood work     GO TO THE FRONT DESK Schedule your next appointment for a  routine checkup in 6 months  After you run out of valsartan, switched to [place] back in 2 weeks after you switch to get blood work to be sure your potassium is okay.    Check the  blood pressure 2 or 3 times a month  y Be sure your blood pressure is between 110/65 and  145/85. If it is consistently higher or lower, let me know

## 2016-11-13 LAB — MITOCHONDRIAL ANTIBODIES: Mitochondrial M2 Ab, IgG: <=20 Units (ref <=20.0)

## 2016-11-13 LAB — ANTI-SMOOTH MUSCLE ANTIBODY, IGG

## 2016-11-13 LAB — HEPATITIS B SURFACE ANTIGEN: Hepatitis B Surface Ag: NONREACTIVE

## 2016-11-13 LAB — HEPATITIS B CORE ANTIBODY, TOTAL: Hep B Core Total Ab: NONREACTIVE

## 2016-11-13 LAB — HIV ANTIBODY (ROUTINE TESTING W REFLEX): HIV 1&2 Ab, 4th Generation: NONREACTIVE

## 2016-11-13 LAB — HEPATITIS C ANTIBODY: HCV Ab: NONREACTIVE

## 2016-11-14 LAB — TRANSFERRIN SATURATION
IRON SATN MFR SERPL: 41 %{saturation}
IRON SERPL-MCNC: 120 ug/dL
TRANSFERRIN SERPL-MCNC: 211 mg/dL

## 2016-11-16 LAB — ANTI-NUCLEAR AB-TITER (ANA TITER)

## 2016-11-16 LAB — ANA: ANA: POSITIVE — AB

## 2016-11-17 ENCOUNTER — Other Ambulatory Visit: Payer: Self-pay | Admitting: Internal Medicine

## 2016-11-18 LAB — VITAMIN D 1,25 DIHYDROXY
VITAMIN D3 1, 25 (OH): 37 pg/mL
Vitamin D 1, 25 (OH)2 Total: 37 pg/mL (ref 18–72)

## 2016-11-23 ENCOUNTER — Encounter: Payer: Self-pay | Admitting: Gastroenterology

## 2016-11-23 NOTE — Addendum Note (Signed)
Addended byDamita Dunnings D on: 11/23/2016 10:24 AM   Modules accepted: Orders

## 2016-12-10 ENCOUNTER — Other Ambulatory Visit (INDEPENDENT_AMBULATORY_CARE_PROVIDER_SITE_OTHER): Payer: BC Managed Care – PPO

## 2016-12-10 DIAGNOSIS — I10 Essential (primary) hypertension: Secondary | ICD-10-CM

## 2016-12-11 LAB — BASIC METABOLIC PANEL
BUN: 10 mg/dL (ref 6–23)
CALCIUM: 9.7 mg/dL (ref 8.4–10.5)
CO2: 27 mEq/L (ref 19–32)
CREATININE: 1 mg/dL (ref 0.40–1.20)
Chloride: 101 mEq/L (ref 96–112)
GFR: 60.83 mL/min (ref >60.00)
GLUCOSE: 113 mg/dL — AB (ref 70–99)
Potassium: 4.6 mEq/L (ref 3.5–5.1)
SODIUM: 137 meq/L (ref 135–145)

## 2017-01-20 ENCOUNTER — Ambulatory Visit: Payer: BC Managed Care – PPO | Admitting: Gastroenterology

## 2017-03-15 ENCOUNTER — Encounter: Payer: Self-pay | Admitting: Gastroenterology

## 2017-03-15 ENCOUNTER — Ambulatory Visit: Payer: BC Managed Care – PPO | Admitting: Gastroenterology

## 2017-03-15 ENCOUNTER — Encounter (INDEPENDENT_AMBULATORY_CARE_PROVIDER_SITE_OTHER): Payer: Self-pay

## 2017-03-15 ENCOUNTER — Other Ambulatory Visit (INDEPENDENT_AMBULATORY_CARE_PROVIDER_SITE_OTHER): Payer: BC Managed Care – PPO

## 2017-03-15 VITALS — BP 126/80 | HR 72 | Ht 64.0 in | Wt 207.0 lb

## 2017-03-15 DIAGNOSIS — R7989 Other specified abnormal findings of blood chemistry: Secondary | ICD-10-CM

## 2017-03-15 DIAGNOSIS — R945 Abnormal results of liver function studies: Secondary | ICD-10-CM

## 2017-03-15 NOTE — Progress Notes (Addendum)
History of Present Illness: This is a 56 year old female referred by Colon Branch, MD for the evaluation of elevated LFTs. ANA + 1:1,280.  She relates being told of elevated liver tests for about 4-5 years.  Previous LFTs evaluation in 2014 at [facility] included an abdominal ultrasound showing hepatic steatosis by patient report.  LFT abnormalities over the past year summarized below.  Prior colonoscopy in 2014 at [facility] which pt reports was normal.  She relates she had a first cousin who died at 87 of cirrhosis who was a heavy alcohol and drug abuser.  No other family history of liver disease.  Denies prior blood transfusions or IV drug use. Denies weight loss, abdominal pain, constipation, diarrhea, change in stool caliber, melena, hematochezia, nausea, vomiting, dysphagia, reflux symptoms, chest pain.  SMAb, M2Ab, Hep B, Hep C, Fe, TIBC, HIV negative or normal                                                                      11/2016                05/2016                  05/2016 AST 0 - 37 U/L 76 Abnormally high    59 Abnormally high  R  ALT 0 - 35 U/L 111 Abnormally high   87 Abnormally high  R      Allergies  Allergen Reactions  . Cephalexin Anaphylaxis  . Penicillins Hives   Outpatient Medications Prior to Visit  Medication Sig Dispense Refill  . latanoprost (XALATAN) 0.005 % ophthalmic solution Place 1 drop into both eyes at bedtime.    Marland Kitchen losartan (COZAAR) 100 MG tablet Take 1 tablet (100 mg total) by mouth daily. 30 tablet 6  . metoprolol succinate (TOPROL-XL) 50 MG 24 hr tablet Take 1 tablet (50 mg total) by mouth daily. Take with or immediately following a meal. 30 tablet 5  . TURMERIC PO Take by mouth.     No facility-administered medications prior to visit.    Past Medical History:  Diagnosis Date  . Allergy   . Early menopause   . Glaucoma   . History of kidney stones   . Hyperlipidemia   . Hypertension   . Vitamin D deficiency    Past Surgical  History:  Procedure Laterality Date  . CESAREAN SECTION  04/1998   x1  . CHOLECYSTECTOMY  08/1998  . EYE SURGERY Right 01-2014   for glaucoma   Social History   Socioeconomic History  . Marital status: Married    Spouse name: None  . Number of children: 1  . Years of education: None  . Highest education level: None  Social Needs  . Financial resource strain: None  . Food insecurity - worry: None  . Food insecurity - inability: None  . Transportation needs - medical: None  . Transportation needs - non-medical: None  Occupational History  . Occupation: Automotive engineer , retiring 2019  Tobacco Use  . Smoking status: Never Smoker  . Smokeless tobacco: Never Used  Substance and Sexual Activity  . Alcohol use: Yes    Alcohol/week: 0.0 oz  Comment: rare   . Drug use: No  . Sexual activity: Not Currently    Partners: Male    Birth control/protection: Post-menopausal  Other Topics Concern  . None  Social History Narrative   Household: pt, husband, son   Family History  Problem Relation Age of Onset  . Alzheimer's disease Mother   . Diabetes Father   . Hypertension Father   . Stroke Father        aneurysm   . Diabetes Paternal Grandmother   . Heart disease Paternal Grandmother   . Hypertension Paternal Grandmother   . Colon cancer Neg Hx   . Breast cancer Neg Hx   . Stomach cancer Neg Hx       Review of Systems: Pertinent positive and negative review of systems were noted in the above HPI section. All other review of systems were otherwise negative.   Physical Exam: General: Well developed, well nourished, no acute distress Head: Normocephalic and atraumatic Eyes:  sclerae anicteric, EOMI Ears: Normal auditory acuity Mouth: No deformity or lesions Neck: Supple, no masses or thyromegaly Lungs: Clear throughout to auscultation Heart: Regular rate and rhythm; no murmurs, rubs or bruits Abdomen: Soft, non tender and non distended. No masses,  hepatosplenomegaly or hernias noted. Normal Bowel sounds Rectal: not done Musculoskeletal: Symmetrical with no gross deformities  Skin: No lesions on visible extremities Pulses:  Normal pulses noted Extremities: No clubbing, cyanosis, edema or deformities noted Neurological: Alert oriented x 4, grossly nonfocal Cervical Nodes:  No significant cervical adenopathy Inguinal Nodes: No significant inguinal adenopathy Psychological:  Alert and cooperative. Normal mood and affect  Assessment and Recommendations:  1.  Elevated LFTs. Elevated ANA. R/O hepatic steatosis, autoimmune hepatitis leading elevated LFTs.  Send ceruloplasmin, A1-AT, anti L/K, IgG, IgA, tTG.  Schedule right upper quadrant ultrasound.  Discussed liver biopsy for further evaluation if blood work is not diagnostic.  Advised a long-term weight loss program with a lower carbohydrate, lower fat diet supervised by her PCP.  cc: Colon Branch, MD [address] [place], [place] [address]

## 2017-03-15 NOTE — Patient Instructions (Signed)
Your physician has requested that you go to the basement for lab work before leaving today.  You have been scheduled for an abdominal ultrasound at [facility] (1st floor of hospital) on 03/18/17 at 11:30am. Please arrive 15 minutes prior to your appointment for registration. Make certain not to have anything to eat or drink 4 hours prior to your appointment. Should you need to reschedule your appointment, please contact radiology at [phone]. This test typically takes about 30 minutes to perform.  Normal BMI (Body Mass Index- based on height and weight) is between 23 and 30. Your BMI today is Body mass index is 35.53 kg/m. Marland Kitchen Please consider follow up  regarding your BMI with your Primary Care Provider.  Thank you for choosing me and [facility].  Pricilla Riffle. Dagoberto Ligas., MD., Marval Regal

## 2017-03-16 LAB — IGA: IGA: 275 mg/dL (ref 68–378)

## 2017-03-16 LAB — ANTI-MICROSOMAL ANTIBODY LIVER / KIDNEY: LKM1 AB: 0.7 U (ref 0.0–20.0)

## 2017-03-17 LAB — IGG: IGG (IMMUNOGLOBIN G), SERUM: 1170 mg/dL (ref 694–1618)

## 2017-03-17 LAB — TISSUE TRANSGLUTAMINASE, IGA: (tTG) Ab, IgA: 1 U/mL

## 2017-03-17 LAB — ALPHA-1-ANTITRYPSIN: A-1 Antitrypsin, Ser: 172 mg/dL (ref 83–199)

## 2017-03-17 LAB — CERULOPLASMIN: CERULOPLASMIN: 27 mg/dL (ref 18–53)

## 2017-03-18 ENCOUNTER — Ambulatory Visit (HOSPITAL_COMMUNITY)
Admission: RE | Admit: 2017-03-18 | Discharge: 2017-03-18 | Disposition: A | Discharge status: Home / Self Care | Payer: BC Managed Care – PPO | Source: Ambulatory Visit | Attending: Gastroenterology | Admitting: Gastroenterology

## 2017-03-18 DIAGNOSIS — Z9049 Acquired absence of other specified parts of digestive tract: Secondary | ICD-10-CM | POA: Diagnosis not present

## 2017-03-18 DIAGNOSIS — K769 Liver disease, unspecified: Secondary | ICD-10-CM | POA: Insufficient documentation

## 2017-03-18 DIAGNOSIS — R945 Abnormal results of liver function studies: Secondary | ICD-10-CM | POA: Diagnosis present

## 2017-03-18 DIAGNOSIS — R7989 Other specified abnormal findings of blood chemistry: Secondary | ICD-10-CM

## 2017-05-17 ENCOUNTER — Ambulatory Visit: Payer: BC Managed Care – PPO | Admitting: Internal Medicine

## 2017-05-17 ENCOUNTER — Encounter: Payer: Self-pay | Admitting: Internal Medicine

## 2017-05-17 ENCOUNTER — Other Ambulatory Visit: Payer: Self-pay | Admitting: Internal Medicine

## 2017-05-17 VITALS — BP 116/76 | HR 68 | Temp 98.2°F | Resp 14 | Ht 64.0 in | Wt 202.5 lb

## 2017-05-17 DIAGNOSIS — R7401 Elevation of levels of liver transaminase levels: Secondary | ICD-10-CM

## 2017-05-17 DIAGNOSIS — E669 Obesity, unspecified: Secondary | ICD-10-CM

## 2017-05-17 DIAGNOSIS — I10 Essential (primary) hypertension: Secondary | ICD-10-CM

## 2017-05-17 DIAGNOSIS — J019 Acute sinusitis, unspecified: Secondary | ICD-10-CM | POA: Diagnosis not present

## 2017-05-17 DIAGNOSIS — R740 Nonspecific elevation of levels of transaminase and lactic acid dehydrogenase [LDH]: Secondary | ICD-10-CM

## 2017-05-17 MED ORDER — LOSARTAN POTASSIUM 100 MG PO TABS: 100.0000 mg | ORAL_TABLET | Freq: Every day | ORAL | 6 refills | Status: DC

## 2017-05-17 MED ORDER — AZITHROMYCIN 250 MG PO TABS: ORAL_TABLET | ORAL | 0 refills | Status: DC

## 2017-05-17 MED ORDER — METOPROLOL SUCCINATE ER 50 MG PO TB24: 50.0000 mg | ORAL_TABLET | Freq: Every day | ORAL | 5 refills | Status: DC

## 2017-05-17 NOTE — Patient Instructions (Addendum)
GO TO THE LAB : Get the blood work     GO TO THE FRONT DESK Schedule your next appointment for a physical exam in 6 months  Check the  blood pressure  monthly  Be sure your blood pressure is between 110/65 and  135/85. If it is consistently higher or lower, let me know  === Rest, fluids , tylenol  For cough:  Take Mucinex DM twice a day as needed until better  For nasal congestion: Use OTC Nasocort or Flonase : 2 nasal sprays on each side of the nose in the morning until you feel better    Take the antibiotic as prescribed   Call if not gradually better over the next  10 days  Call anytime if the symptoms are severe

## 2017-05-17 NOTE — Progress Notes (Signed)
Subjective:    Patient ID: Taylor Holden, female    DOB: [birth year], 57 y.o.   MRN: [id number]  DOS:  05/17/2017 Type of visit - description : f/u  Interval history: HTN: Good compliance with medication, ambulatory BPs are excellent in the 120/80. Increased LFTs: Chart reviewed Developed upper respiratory symptoms a week ago, currently having right facial pressure and pain.  + Sneezing and coughing, taking Tylenol on and off Obesity: Has improved her diet, eating healthier, has lost a few pounds.  Wt Readings from Last 3 Encounters:  05/17/17 202 lb 8 oz (91.9 kg)  03/15/17 207 lb (93.9 kg)  11/12/16 207 lb 6 oz (94.1 kg)     Review of Systems Denies fever, some chills. + Green nasal discharge with traces of blood sporadically. No chest congestion or wheezing.  Past Medical History:  Diagnosis Date  . Allergy   . Early menopause   . Glaucoma   . History of kidney stones   . Hyperlipidemia   . Hypertension   . Vitamin D deficiency     Past Surgical History:  Procedure Laterality Date  . CESAREAN SECTION  04/1998   x1  . CHOLECYSTECTOMY  08/1998  . EYE SURGERY Right 01-2014   for glaucoma    Social History   Socioeconomic History  . Marital status: Married    Spouse name: Not on file  . Number of children: 1  . Years of education: Not on file  . Highest education level: Not on file  Social Needs  . Financial resource strain: Not on file  . Food insecurity - worry: Not on file  . Food insecurity - inability: Not on file  . Transportation needs - medical: Not on file  . Transportation needs - non-medical: Not on file  Occupational History  . Occupation: Automotive engineer , retiring 2019  Tobacco Use  . Smoking status: Never Smoker  . Smokeless tobacco: Never Used  Substance and Sexual Activity  . Alcohol use: Yes    Alcohol/week: 0.0 oz    Comment: rare   . Drug use: No  . Sexual activity: Not Currently    Partners: Male    Birth  control/protection: Post-menopausal  Other Topics Concern  . Not on file  Social History Narrative   Household: pt, husband, son      Allergies as of 05/17/2017      Reactions   Cephalexin Anaphylaxis   Penicillins Hives      Medication List        Accurate as of 05/17/17 11:59 PM. Always use your most recent med list.          azithromycin 250 MG tablet Commonly known as:  ZITHROMAX Take 2 tablets by mouth first day, then 1 tablet for 4 additional days   latanoprost 0.005 % ophthalmic solution Commonly known as:  XALATAN Place 1 drop into both eyes at bedtime.   losartan 100 MG tablet Commonly known as:  COZAAR Take 1 tablet (100 mg total) by mouth daily.   metoprolol succinate 50 MG 24 hr tablet Commonly known as:  TOPROL-XL Take 1 tablet (50 mg total) by mouth daily. Take with or immediately following a meal.   TURMERIC PO Take by mouth.          Objective:   Physical Exam BP 116/76 (BP Location: Right Arm, Patient Position: Sitting, Cuff Size: Small)   Pulse 68   Temp 98.2 F (36.8 C) (Oral)   Resp 14  Ht 5\' 4"  (1.626 m)   Wt 202 lb 8 oz (91.9 kg)   SpO2 96%   BMI 34.76 kg/m  General:   Well developed, well nourished . NAD.  HEENT:  Normocephalic . Face symmetric, atraumatic. TMs: Slightly bulged bilaterally, not red. Nose congested, throat symmetric. Sinuses: Slightly TTP at the right maxillary area. Lungs:  CTA B Normal respiratory effort, no intercostal retractions, no accessory muscle use. Heart: RRR,  no murmur.  No pretibial edema bilaterally  Skin: Not pale. Not jaundice Neurologic:  alert & oriented X3.  Speech normal, gait appropriate for age and unassisted Psych--  Cognition and judgment appear intact.  Cooperative with normal attention span and concentration.  Behavior appropriate. No anxious or depressed appearing.      Assessment & Plan:    Assessment  New pt to me 10-02-15 HTN -- rx meds 2001; in the past hctz caused  low K Obesity Hyperlipidemia Mildly elevated LFTs.  Saw GI 03/2017, additional labs done: normal/negative.  [place] c/w  fatty liver Glaucoma  Early menopause, onset 2011 Vitamin D deficiency Allergies H/o  kidney stones  PLAN HTN: Under excellent control, continue metoprolol, losartan, check a CMP. Obesity:  trying to eat healthier, has lost a few pounds.  Praised.  I gave him information about the local bariatric practices. Increased LFTs: Saw GI, records reviewed, [place] c/w fatty liver, additional labs done on they were either negative or normal. GI planning a BX Sinusitis: Recommend a Z-Pak, supportive treatment, see AVS RTC 6 months CPX

## 2017-05-17 NOTE — Progress Notes (Signed)
Pre visit review using our clinic review tool, if applicable. No additional management support is needed unless otherwise documented below in the visit note. 

## 2017-05-18 ENCOUNTER — Telehealth: Payer: Self-pay

## 2017-05-18 DIAGNOSIS — R7401 Elevation of levels of liver transaminase levels: Secondary | ICD-10-CM

## 2017-05-18 DIAGNOSIS — R740 Nonspecific elevation of levels of transaminase and lactic acid dehydrogenase [LDH]: Principal | ICD-10-CM

## 2017-05-18 DIAGNOSIS — E669 Obesity, unspecified: Secondary | ICD-10-CM | POA: Insufficient documentation

## 2017-05-18 LAB — COMPREHENSIVE METABOLIC PANEL
ALT: 62 U/L — ABNORMAL HIGH (ref 0–35)
AST: 41 U/L — AB (ref 0–37)
Albumin: 4.3 g/dL (ref 3.5–5.2)
Alkaline Phosphatase: 97 U/L (ref 39–117)
BUN: 14 mg/dL (ref 6–23)
CHLORIDE: 102 meq/L (ref 96–112)
CO2: 30 mEq/L (ref 19–32)
CREATININE: 0.93 mg/dL (ref 0.40–1.20)
Calcium: 9.8 mg/dL (ref 8.4–10.5)
GFR: 66.04 mL/min (ref >60.00)
GLUCOSE: 84 mg/dL (ref 70–99)
Potassium: 5.2 mEq/L — ABNORMAL HIGH (ref 3.5–5.1)
SODIUM: 140 meq/L (ref 135–145)
Total Bilirubin: 0.9 mg/dL (ref 0.2–1.2)
Total Protein: 7.8 g/dL (ref 6.0–8.3)

## 2017-05-18 NOTE — Assessment & Plan Note (Signed)
HTN: Under excellent control, continue metoprolol, losartan, check a CMP. Obesity:  trying to eat healthier, has lost a few pounds.  Praised.  I gave him information about the local bariatric practices. Increased LFTs: Saw GI, records reviewed, [place] c/w fatty liver, additional labs done on they were either negative or normal. GI planning a BX Sinusitis: Recommend a Z-Pak, supportive treatment, see AVS RTC 6 months CPX

## 2017-05-18 NOTE — Telephone Encounter (Signed)
-----   Message from Ladene Artist, MD sent at 05/18/2017 10:12 AM EST ----- Regarding: RE: LFTs Taylor Holden, Yes the next step if liver biopsy and we will contact her to further discuss liver biopsy and schedule if she is agreeable. [name]   ----- Message ----- From: Colon Branch, MD Sent: 05/17/2017   5:01 PM To: Ladene Artist, MD, Colon Branch, MD Subject: LFTs                                           Taylor Holden, thank you for seeing this lady a few weeks ago, the additional  Labs you ordered  were negative, you mention possibly a liver biopsy, are you  still planning that? Otherwise I'll focus on wt loss and monitoring THX! JP

## 2017-05-18 NOTE — Telephone Encounter (Signed)
Patient notified of recommendations.  She consents to proceed with liver biopsy.  She is aware that they will contact her directly with an appt date and time.

## 2017-05-19 NOTE — Telephone Encounter (Signed)
Liver biopsy has been scheduled for 05/28/17

## 2017-05-26 ENCOUNTER — Other Ambulatory Visit: Payer: Self-pay | Admitting: Radiology

## 2017-05-27 ENCOUNTER — Other Ambulatory Visit: Payer: Self-pay | Admitting: General Surgery

## 2017-05-28 ENCOUNTER — Encounter (HOSPITAL_COMMUNITY): Payer: Self-pay

## 2017-05-28 ENCOUNTER — Ambulatory Visit (HOSPITAL_COMMUNITY)
Admission: RE | Admit: 2017-05-28 | Discharge: 2017-05-28 | Disposition: A | Discharge status: Home / Self Care | Payer: BC Managed Care – PPO | Source: Ambulatory Visit | Attending: Gastroenterology | Admitting: Gastroenterology

## 2017-05-28 DIAGNOSIS — K8309 Other cholangitis: Secondary | ICD-10-CM | POA: Insufficient documentation

## 2017-05-28 DIAGNOSIS — R7401 Elevation of levels of liver transaminase levels: Secondary | ICD-10-CM

## 2017-05-28 DIAGNOSIS — R740 Nonspecific elevation of levels of transaminase and lactic acid dehydrogenase [LDH]: Secondary | ICD-10-CM | POA: Insufficient documentation

## 2017-05-28 DIAGNOSIS — K7581 Nonalcoholic steatohepatitis (NASH): Secondary | ICD-10-CM | POA: Insufficient documentation

## 2017-05-28 LAB — CBC
HEMATOCRIT: 44.4 % (ref 36.0–46.0)
HEMOGLOBIN: 14.4 g/dL (ref 12.0–15.0)
MCH: 28.4 pg (ref 26.0–34.0)
MCHC: 32.4 g/dL (ref 30.0–36.0)
MCV: 87.6 fL (ref 78.0–100.0)
Platelets: 269 10*3/uL (ref 150–400)
RBC: 5.07 MIL/uL (ref 3.87–5.11)
RDW: 13.2 % (ref 11.5–15.5)
WBC: 6.5 10*3/uL (ref 4.0–10.5)

## 2017-05-28 LAB — PROTIME-INR
INR: 1
Prothrombin Time: 13.1 seconds (ref 11.4–15.2)

## 2017-05-28 MED ORDER — SODIUM CHLORIDE 0.9 % IV SOLN: INTRAVENOUS | Status: DC

## 2017-05-28 MED ORDER — MIDAZOLAM HCL 2 MG/2ML IJ SOLN
INTRAMUSCULAR | Status: AC
  Filled 2017-05-28: qty 2

## 2017-05-28 MED ORDER — LIDOCAINE HCL (PF) 1 % IJ SOLN
INTRAMUSCULAR | Status: AC
  Filled 2017-05-28: qty 30

## 2017-05-28 MED ORDER — FENTANYL CITRATE (PF) 100 MCG/2ML IJ SOLN
INTRAMUSCULAR | Status: AC | PRN
  Administered 2017-05-28: 50 ug via INTRAVENOUS
  Administered 2017-05-28: 25 ug via INTRAVENOUS

## 2017-05-28 MED ORDER — FENTANYL CITRATE (PF) 100 MCG/2ML IJ SOLN
INTRAMUSCULAR | Status: AC
  Filled 2017-05-28: qty 2

## 2017-05-28 MED ORDER — GELATIN ABSORBABLE 12-7 MM EX MISC
CUTANEOUS | Status: AC
  Filled 2017-05-28: qty 1

## 2017-05-28 MED ORDER — MIDAZOLAM HCL 2 MG/2ML IJ SOLN
INTRAMUSCULAR | Status: AC | PRN
Start: 2017-05-28 — End: 2017-05-28
  Administered 2017-05-28: 1 mg via INTRAVENOUS
  Administered 2017-05-28: 0.5 mg via INTRAVENOUS

## 2017-05-28 NOTE — H&P (Signed)
Chief Complaint: Patient was seen in consultation today for random liver biopsy at the request of Stark,[name] T  Referring Physician(s): Stark,[name] T  Supervising Physician: Daryll Brod  Patient Status: [facility] - Out-pt  History of Present Illness: Taylor Holden is a 57 y.o. female   Known elevated liver functions for a few years Followed by Dr Fuller Plan Persistent elevation  [place] 03/18/17: IMPRESSION: Increased hepatic echotexture most compatible with fatty infiltrative change though other hepatocellular processes could produce similar findings. No suspicious masses are observed  Scheduled now for random liver biopsy  Past Medical History:  Diagnosis Date  . Allergy   . Early menopause   . Glaucoma   . History of kidney stones   . Hyperlipidemia   . Hypertension   . Vitamin D deficiency     Past Surgical History:  Procedure Laterality Date  . CESAREAN SECTION  04/1998   x1  . CHOLECYSTECTOMY  08/1998  . EYE SURGERY Right 01-2014   for glaucoma    Allergies: Cephalexin; Penicillins; and Prednisone  Medications: Prior to Admission medications   Medication Sig Start Date End Date Taking? Authorizing Provider  acetaminophen (TYLENOL) 500 MG tablet Take 1,000 mg by mouth every 8 (eight) hours as needed for mild pain or headache.   Yes [provider]  latanoprost (XALATAN) 0.005 % ophthalmic solution Place 1 drop into both eyes at bedtime.   Yes [provider]  losartan (COZAAR) 100 MG tablet Take 1 tablet (100 mg total) by mouth daily. 05/17/17  Yes [name], Alda Berthold, MD  metoprolol succinate (TOPROL-XL) 50 MG 24 hr tablet Take 1 tablet (50 mg total) by mouth daily. Take with or immediately following a meal. Patient taking differently: Take 50 mg by mouth every evening. Take with or immediately following a meal. 05/17/17  Yes [name], Alda Berthold, MD  TURMERIC PO Take 1 tablet by mouth daily.    Yes [provider]  azithromycin (ZITHROMAX) 250 MG  tablet Take 2 tablets by mouth first day, then 1 tablet for 4 additional days Patient not taking: Reported on 05/26/2017 05/17/17   Colon Branch, MD     Family History  Problem Relation Age of Onset  . Alzheimer's disease Mother   . Diabetes Father   . Hypertension Father   . Stroke Father        aneurysm   . Diabetes Paternal Grandmother   . Heart disease Paternal Grandmother   . Hypertension Paternal Grandmother   . Colon cancer Neg Hx   . Breast cancer Neg Hx   . Stomach cancer Neg Hx     Social History   Socioeconomic History  . Marital status: Married    Spouse name: None  . Number of children: 1  . Years of education: None  . Highest education level: None  Social Needs  . Financial resource strain: None  . Food insecurity - worry: None  . Food insecurity - inability: None  . Transportation needs - medical: None  . Transportation needs - non-medical: None  Occupational History  . Occupation: Automotive engineer , retiring 2019  Tobacco Use  . Smoking status: Never Smoker  . Smokeless tobacco: Never Used  Substance and Sexual Activity  . Alcohol use: Yes    Alcohol/week: 0.0 oz    Comment: rare   . Drug use: No  . Sexual activity: Not Currently    Partners: Male    Birth control/protection: Post-menopausal  Other Topics Concern  .  None  Social History Narrative   Household: pt, husband, son     Review of Systems: A 12 point ROS discussed and pertinent positives are indicated in the HPI above.  All other systems are negative.  Review of Systems  Constitutional: Negative for activity change, appetite change, fatigue and fever.  Respiratory: Negative for cough and shortness of breath.   Cardiovascular: Negative for chest pain.  Gastrointestinal: Negative for abdominal pain.  Neurological: Negative for weakness.  Psychiatric/Behavioral: Negative for behavioral problems and confusion.    Vital Signs: BP (!) 143/80   Pulse 62   Temp 97.8 F (36.6 C)  (Oral)   Resp 16   Ht 5\' 4"  (1.626 m)   Wt 202 lb (91.6 kg)   SpO2 97%   BMI 34.67 kg/m   Physical Exam  Constitutional: She is oriented to person, place, and time.  Cardiovascular: Normal rate, regular rhythm and normal heart sounds.  Pulmonary/Chest: Effort normal.  Abdominal: Soft. Bowel sounds are normal.  Musculoskeletal: Normal range of motion.  Neurological: She is alert and oriented to person, place, and time.  Skin: Skin is warm and dry.  Psychiatric: She has a normal mood and affect. Her behavior is normal. Judgment and thought content normal.  Nursing note and vitals reviewed.   Imaging: No results found.  Labs:  CBC: Recent Labs    11/12/16 1048 05/28/17 0610  WBC 5.6 6.5  HGB 15.0 14.4  HCT 45.6 44.4  PLT 232.0 269    COAGS: No results for input(s): INR, APTT in the last 8760 hours.  BMP: Recent Labs    12/10/16 1636 05/17/17 1703  NA 137 140  K 4.6 5.2*  CL 101 102  CO2 27 30  GLUCOSE 113* 84  BUN 10 14  CALCIUM 9.7 9.8  CREATININE 1.00 0.93    LIVER FUNCTION TESTS: Recent Labs    11/12/16 1048 05/17/17 1703  BILITOT 0.8 0.9  AST 76* 41*  ALT 111* 62*  ALKPHOS 99 97  PROT 7.3 7.8  ALBUMIN 4.0 4.3    TUMOR MARKERS: No results for input(s): AFPTM, CEA, CA199, CHROMGRNA in the last 8760 hours.  Assessment and Plan:  Elevated liver functions Fatty liver on imaging Scheduled for random liver biopsy Risks and benefits discussed with the patient including, but not limited to bleeding, infection, damage to adjacent structures or low yield requiring additional tests.  All of the patient's questions were answered, patient is agreeable to proceed. Consent signed and in chart.   Thank you for this interesting consult.  I greatly enjoyed meeting MARIAN MENEELY and look forward to participating in their care.  A copy of this report was sent to the requesting provider on this date.  Electronically Signed: Lavonia Drafts,  PA-C 05/28/2017, 6:56 AM   I spent a total of  30 Minutes   in face to face in clinical consultation, greater than 50% of which was counseling/coordinating care for random liver bx

## 2017-05-28 NOTE — Discharge Instructions (Addendum)
Liver Biopsy, Care After °Refer to this sheet in the next few weeks. These instructions provide you with information on caring for yourself after your procedure. Your health care provider may also give you more specific instructions. Your treatment has been planned according to current medical practices, but problems sometimes occur. Call your health care provider if you have any problems or questions after your procedure. °What can I expect after the procedure? °After your procedure, it is typical to have the following: °· A small amount of discomfort in the area where the biopsy was done and in the right shoulder or shoulder blade. °· A small amount of bruising around the area where the biopsy was done and on the skin over the liver. °· Sleepiness and fatigue for the rest of the day. ° °Follow these instructions at home: °· Rest at home for 1-2 days or as directed by your health care provider. °· Have a friend or family member stay with you for at least 24 hours. °· Because of the medicines used during the procedure, you should not do the following things in the first 24 hours: °? Drive. °? Use machinery. °? Be responsible for the care of other people. °? Sign legal documents. °? Take a bath or shower. °· There are many different ways to close and cover an incision, including stitches, skin glue, and adhesive strips. Follow your health care provider's instructions on: °? Incision care. °? Bandage (dressing) changes and removal. °? Incision closure removal. °· Do not drink alcohol in the first week. °· Do not lift more than 5 pounds or play contact sports for 2 weeks after this test. °· Take medicines only as directed by your health care provider. Do not take medicine containing aspirin or non-steroidal anti-inflammatory medicines such as ibuprofen for 1 week after this test. °· It is your responsibility to get your test results. °Contact a health care provider if: °· You have increased bleeding from an incision  that results in more than a small spot of blood. °· You have redness, swelling, or increasing pain in any incisions. °· You notice a discharge or a bad smell coming from any of your incisions. °· You have a fever or chills. °Get help right away if: °· You develop swelling, bloating, or pain in your abdomen. °· You become dizzy or faint. °· You develop a rash. °· You are nauseous or vomit. °· You have difficulty breathing, feel short of breath, or feel faint. °· You develop chest pain. °· You have problems with your speech or vision. °· You have trouble balancing or moving your arms or legs. °This information is not intended to replace advice given to you by your health care provider. Make sure you discuss any questions you have with your health care provider. °Document Released: 10/17/2004 Document Revised: 09/05/2015 Document Reviewed: 05/26/2013 °Elsevier Interactive Patient Education © 2018 [employer] °Moderate Conscious Sedation, Adult, Care After °These instructions provide you with information about caring for yourself after your procedure. Your health care provider may also give you more specific instructions. Your treatment has been planned according to current medical practices, but problems sometimes occur. Call your health care provider if you have any problems or questions after your procedure. °What can I expect after the procedure? °After your procedure, it is common: °· To feel sleepy for several hours. °· To feel clumsy and have poor balance for several hours. °· To have poor judgment for several hours. °· To vomit if you eat   too soon. ° °Follow these instructions at home: °For at least 24 hours after the procedure: ° °· Do not: °? Participate in activities where you could fall or become injured. °? Drive. °? Use heavy machinery. °? Drink alcohol. °? Take sleeping pills or medicines that cause drowsiness. °? Make important decisions or sign legal documents. °? Take care of children on your  own. °· Rest. °Eating and drinking °· Follow the diet recommended by your health care provider. °· If you vomit: °? Drink water, juice, or soup when you can drink without vomiting. °? Make sure you have little or no nausea before eating solid foods. °General instructions °· Have a responsible adult stay with you until you are awake and alert. °· Take over-the-counter and prescription medicines only as told by your health care provider. °· If you smoke, do not smoke without supervision. °· Keep all follow-up visits as told by your health care provider. This is important. °Contact a health care provider if: °· You keep feeling nauseous or you keep vomiting. °· You feel light-headed. °· You develop a rash. °· You have a fever. °Get help right away if: °· You have trouble breathing. °This information is not intended to replace advice given to you by your health care provider. Make sure you discuss any questions you have with your health care provider. °Document Released: 01/18/2013 Document Revised: 09/02/2015 Document Reviewed: 07/20/2015 °Elsevier Interactive Patient Education © 2018 [employer] Inc[employer] ° °

## 2017-05-28 NOTE — Procedures (Signed)
Increased LFTs  S/p US liver bx  No comp Stable EBL 0 Path pending Full report in pacs

## 2017-05-28 NOTE — Sedation Documentation (Signed)
Patient is resting comfortably. 

## 2017-06-01 ENCOUNTER — Telehealth: Payer: Self-pay | Admitting: Internal Medicine

## 2017-06-01 NOTE — Telephone Encounter (Signed)
Copied from [place]. Topic: Quick Communication - Other Results >> Jun 01, 2017  2:40 PM Robina Ade, Helene Kelp D wrote: Patient would like to know the results for her liver biopsy ones they are in. Please call patient back, thanks.

## 2017-06-01 NOTE — Telephone Encounter (Signed)
Will defer to GI

## 2017-06-01 NOTE — Telephone Encounter (Signed)
Patient notified via voicemail that liver biopsy results are still pending and that would expect them to result on Thursday or Friday and that we will call as soon as we have them

## 2017-06-01 NOTE — Telephone Encounter (Signed)
See note. Liver pathology not available to me in Epic.

## 2017-06-04 NOTE — Telephone Encounter (Signed)
Advised patient: I just look at the chart, report is not ready, when it is ready she should receive a call from GI.

## 2017-06-04 NOTE — Telephone Encounter (Signed)
Pt called - she is asking for call about liver biopsy - please call [phone]

## 2017-06-04 NOTE — Telephone Encounter (Signed)
Spoke w/ Pt, informed liver bx not back. Pt verbalized understanding.

## 2017-10-30 ENCOUNTER — Other Ambulatory Visit: Payer: Self-pay | Admitting: Internal Medicine

## 2017-11-12 ENCOUNTER — Encounter: Payer: Self-pay | Admitting: Internal Medicine

## 2017-11-15 ENCOUNTER — Encounter: Payer: Self-pay | Admitting: Internal Medicine

## 2017-11-15 ENCOUNTER — Ambulatory Visit: Payer: BC Managed Care – PPO | Admitting: Internal Medicine

## 2017-11-15 VITALS — BP 120/74 | HR 72 | Temp 98.2°F | Resp 16 | Ht 64.0 in | Wt 198.2 lb

## 2017-11-15 DIAGNOSIS — E669 Obesity, unspecified: Secondary | ICD-10-CM | POA: Diagnosis not present

## 2017-11-15 DIAGNOSIS — E785 Hyperlipidemia, unspecified: Secondary | ICD-10-CM

## 2017-11-15 DIAGNOSIS — R109 Unspecified abdominal pain: Secondary | ICD-10-CM | POA: Diagnosis not present

## 2017-11-15 DIAGNOSIS — I10 Essential (primary) hypertension: Secondary | ICD-10-CM

## 2017-11-15 LAB — LIPID PANEL
Cholesterol: 198 mg/dL (ref 0–200)
HDL: 64.5 mg/dL (ref >39.00)
LDL Cholesterol: 110 mg/dL — ABNORMAL HIGH (ref 0–99)
NONHDL: 133.33
Total CHOL/HDL Ratio: 3
Triglycerides: 118 mg/dL (ref 0.0–149.0)
VLDL: 23.6 mg/dL (ref 0.0–40.0)

## 2017-11-15 LAB — BASIC METABOLIC PANEL
BUN: 10 mg/dL (ref 6–23)
CHLORIDE: 105 meq/L (ref 96–112)
CO2: 26 mEq/L (ref 19–32)
Calcium: 9.4 mg/dL (ref 8.4–10.5)
Creatinine, Ser: 0.88 mg/dL (ref 0.40–1.20)
GFR: 70.26 mL/min (ref >60.00)
GLUCOSE: 98 mg/dL (ref 70–99)
POTASSIUM: 4.1 meq/L (ref 3.5–5.1)
Sodium: 139 mEq/L (ref 135–145)

## 2017-11-15 LAB — URINALYSIS, ROUTINE W REFLEX MICROSCOPIC
Bilirubin Urine: NEGATIVE
HGB URINE DIPSTICK: NEGATIVE
Ketones, ur: NEGATIVE
NITRITE: NEGATIVE
RBC / HPF: NONE SEEN (ref 0–?)
SPECIFIC GRAVITY, URINE: 1.02 (ref 1.000–1.030)
TOTAL PROTEIN, URINE-UPE24: NEGATIVE
URINE GLUCOSE: NEGATIVE
Urobilinogen, UA: 0.2 (ref 0.0–1.0)
pH: 6 (ref 5.0–8.0)

## 2017-11-15 NOTE — Progress Notes (Signed)
Subjective:    Patient ID: Taylor Holden, female    DOB: Mar 30, 1961, 57 y.o.   MRN: [id number]  DOS:  11/15/2017 Type of visit - description : rov  Interval history: HTN: Good compliance with medication. Obesity: Since she retired, she is doing better, has more time to prepare meals and she remains active. Reports occasional pain on the left flank for the last few days, on and off, lasts 30 minutes, some radiation to the left lower abdomen.  Reminds her of previous kidney stone. Tylenol seems to help. Pain is not worse with moving her torso. She has also noted lower anterior abdominal pain, sharp, lasts for seconds, 2 to 3 episodes in the last few months  Review of Systems  Denies fever chills No nausea, vomiting, diarrhea, constipation or blood in the stools No gross hematuria but some urinary urgency and fullness. Past Medical History:  Diagnosis Date  . Allergy   . Early menopause   . Glaucoma   . History of kidney stones   . Hyperlipidemia   . Hypertension   . Vitamin D deficiency     Past Surgical History:  Procedure Laterality Date  . CESAREAN SECTION  04/1998   x1  . CHOLECYSTECTOMY  08/1998  . EYE SURGERY Right 01-2014   for glaucoma    Social History   Socioeconomic History  . Marital status: Married    Spouse name: Not on file  . Number of children: 1  . Years of education: Not on file  . Highest education level: Not on file  Occupational History  . Occupation: RETIRED Occupational hygienist , retiring 2019  Social Needs  . Financial resource strain: Not on file  . Food insecurity:    Worry: Not on file    Inability: Not on file  . Transportation needs:    Medical: Not on file    Non-medical: Not on file  Tobacco Use  . Smoking status: Never Smoker  . Smokeless tobacco: Never Used  Substance and Sexual Activity  . Alcohol use: Yes    Alcohol/week: 0.0 oz    Comment: rare   . Drug use: No  . Sexual activity: Not Currently   Partners: Male    Birth control/protection: Post-menopausal  Lifestyle  . Physical activity:    Days per week: Not on file    Minutes per session: Not on file  . Stress: Not on file  Relationships  . Social connections:    Talks on phone: Not on file    Gets together: Not on file    Attends religious service: Not on file    Active member of club or organization: Not on file    Attends meetings of clubs or organizations: Not on file    Relationship status: Not on file  . Intimate partner violence:    Fear of current or ex partner: Not on file    Emotionally abused: Not on file    Physically abused: Not on file    Forced sexual activity: Not on file  Other Topics Concern  . Not on file  Social History Narrative   Household: pt, husband, son      Allergies as of 11/15/2017      Reactions   Cephalexin Anaphylaxis, Hives   Penicillins Hives, Shortness Of Breath, Swelling   Facial swelling  Has patient had a PCN reaction causing immediate rash, facial/tongue/throat swelling, SOB or lightheadedness with hypotension: Yes Has patient had a PCN reaction causing severe  rash involving mucus membranes or skin necrosis: Yes Has patient had a PCN reaction that required hospitalization: No Has patient had a PCN reaction occurring within the last 10 years: No If all of the above answers are "NO", then may proceed with Cephalosporin use.   Prednisone Swelling   Facial swelling and joint swelling       Medication List        Accurate as of 11/15/17  7:53 PM. Always use your most recent med list.          acetaminophen 500 MG tablet Commonly known as:  TYLENOL Take 1,000 mg by mouth every 8 (eight) hours as needed for mild pain or headache.   latanoprost 0.005 % ophthalmic solution Commonly known as:  XALATAN Place 1 drop into both eyes at bedtime.   losartan 100 MG tablet Commonly known as:  COZAAR Take 1 tablet (100 mg total) by mouth daily.   metoprolol succinate 50 MG 24 hr  tablet Commonly known as:  TOPROL-XL Take 1 tablet (50 mg total) by mouth every evening. Take with or immediately following a meal.   TURMERIC PO Take 1 tablet by mouth daily.          Objective:   Physical Exam BP 120/74 (BP Location: Left Arm, Patient Position: Sitting, Cuff Size: Small)   Pulse 72   Temp 98.2 F (36.8 C) (Oral)   Resp 16   Ht 5\' 4"  (1.626 m)   Wt 198 lb 4 oz (89.9 kg)   SpO2 97%   BMI 34.03 kg/m  General:   Well developed, NAD, see BMI.  HEENT:  Normocephalic . Face symmetric, atraumatic Lungs:  CTA B Normal respiratory effort, no intercostal retractions, no accessory muscle use. Heart: RRR,  no murmur.  no pretibial edema bilaterally  Abdomen:  Not distended, soft, non-tender. No rebound or rigidity.   no CVA tenderness. MSK: Slightly TTP at the lumbar spine bilaterally. Skin: Not pale. Not jaundice Neurologic:  alert & oriented X3.  Speech normal, gait appropriate for age and unassisted Psych--  Cognition and judgment appear intact.  Cooperative with normal attention span and concentration.  Behavior appropriate. No anxious or depressed appearing.     Assessment & Plan:   Assessment  New pt to me 10-02-15 HTN -- rx meds 2001; in the past hctz caused low K Obesity Hyperlipidemia Mildly elevated LFTs.  Saw GI 03/2017, additional labs done: normal/negative.  [place] c/w  fatty liver; liver Bx 05/2017: Steatohepatitis Glaucoma  Early menopause, onset 2011 Vitamin D deficiency Allergies H/o  kidney stones  PLAN  HTN: Seems controlled on losartan, check a BMP and FLP Obesity: Since she retired, she is eating healthier, recommend routine exercise. Hyperlipidemia: Diet controlled, check a FLP Steatohepatitis documented by biopsy 05/2017, weight loss recommended/discussed Flank, abdominal pain: Reminiscent to previous kidney stones, exam is benign, MSK?  Recommend stretching, Tylenol, call if no better.  Check a UA urine culture Preventive care:    shingrix  Discussed, will wait for now Plans to see gynecology and get a mammogram  RTC CPX 6 months

## 2017-11-15 NOTE — Assessment & Plan Note (Signed)
HTN: Seems controlled on losartan, check a BMP and FLP Obesity: Since she retired, she is eating healthier, recommend routine exercise. Hyperlipidemia: Diet controlled, check a FLP Steatohepatitis documented by biopsy 05/2017, weight loss recommended/discussed Flank, abdominal pain: Reminiscent to previous kidney stones, exam is benign, MSK?  Recommend stretching, Tylenol, call if no better.  Check a UA urine culture Preventive care:  shingrix  Discussed, will wait for now Plans to see gynecology and get a mammogram  RTC CPX 6 months

## 2017-11-15 NOTE — Patient Instructions (Signed)
GO TO THE LAB : Get the blood work     GO TO THE FRONT DESK Schedule your next appointment for a physical exam in 6 months  GENERAL INFORMATION ABOUT HEALTHY EATING The American Heart Association, www.heart.org  Check the "Life's Simple 7" from the [facility] The American diabetes Association www.diabetes.org  "[facility] [place]" book

## 2017-11-15 NOTE — Progress Notes (Signed)
Pre visit review using our clinic review tool, if applicable. No additional management support is needed unless otherwise documented below in the visit note. 

## 2017-11-17 LAB — URINE CULTURE
MICRO NUMBER:: 90922579
SPECIMEN QUALITY:: ADEQUATE

## 2017-11-17 MED ORDER — SULFAMETHOXAZOLE-TRIMETHOPRIM 800-160 MG PO TABS: 1.0000 | ORAL_TABLET | Freq: Two times a day (BID) | ORAL | 0 refills | Status: DC

## 2017-11-17 NOTE — Addendum Note (Signed)
Addended byDamita Dunnings D on: 11/17/2017 02:19 PM   Modules accepted: Orders

## 2017-11-18 ENCOUNTER — Telehealth: Payer: Self-pay | Admitting: Internal Medicine

## 2017-11-18 NOTE — Telephone Encounter (Signed)
Copied from [place] [id number]. Topic: Quick Communication - Lab Results >> Nov 17, 2017  2:17 PM Damita Dunnings, CMA wrote: Called patient to inform them of lab results from 11/15/2017. When patient returns call, triage nurse may disclose results.  Pt returning call for lab results

## 2017-11-18 NOTE — Telephone Encounter (Signed)
Charted in result notes. 

## 2018-01-04 ENCOUNTER — Other Ambulatory Visit: Payer: Self-pay | Admitting: Internal Medicine

## 2018-03-02 ENCOUNTER — Other Ambulatory Visit: Payer: Self-pay | Admitting: Internal Medicine

## 2018-05-18 ENCOUNTER — Ambulatory Visit (INDEPENDENT_AMBULATORY_CARE_PROVIDER_SITE_OTHER): Payer: BC Managed Care – PPO | Admitting: Internal Medicine

## 2018-05-18 ENCOUNTER — Encounter: Payer: Self-pay | Admitting: Internal Medicine

## 2018-05-18 VITALS — BP 126/74 | HR 74 | Temp 98.0°F | Resp 16 | Ht 64.0 in | Wt 203.0 lb

## 2018-05-18 DIAGNOSIS — Z1211 Encounter for screening for malignant neoplasm of colon: Secondary | ICD-10-CM

## 2018-05-18 DIAGNOSIS — E875 Hyperkalemia: Secondary | ICD-10-CM

## 2018-05-18 DIAGNOSIS — Z23 Encounter for immunization: Secondary | ICD-10-CM

## 2018-05-18 DIAGNOSIS — Z0000 Encounter for general adult medical examination without abnormal findings: Secondary | ICD-10-CM

## 2018-05-18 DIAGNOSIS — Z1272 Encounter for screening for malignant neoplasm of vagina: Secondary | ICD-10-CM

## 2018-05-18 DIAGNOSIS — Z01419 Encounter for gynecological examination (general) (routine) without abnormal findings: Secondary | ICD-10-CM

## 2018-05-18 DIAGNOSIS — Z1231 Encounter for screening mammogram for malignant neoplasm of breast: Secondary | ICD-10-CM

## 2018-05-18 DIAGNOSIS — Z780 Asymptomatic menopausal state: Secondary | ICD-10-CM

## 2018-05-18 LAB — COMPREHENSIVE METABOLIC PANEL
ALT: 31 U/L (ref 0–35)
AST: 24 U/L (ref 0–37)
Albumin: 4.3 g/dL (ref 3.5–5.2)
Alkaline Phosphatase: 88 U/L (ref 39–117)
BILIRUBIN TOTAL: 0.9 mg/dL (ref 0.2–1.2)
BUN: 12 mg/dL (ref 6–23)
CO2: 29 meq/L (ref 19–32)
Calcium: 9.8 mg/dL (ref 8.4–10.5)
Chloride: 104 mEq/L (ref 96–112)
Creatinine, Ser: 1.01 mg/dL (ref 0.40–1.20)
GFR: 56.29 mL/min — AB (ref >60.00)
GLUCOSE: 77 mg/dL (ref 70–99)
POTASSIUM: 5.3 meq/L — AB (ref 3.5–5.1)
SODIUM: 142 meq/L (ref 135–145)
Total Protein: 7 g/dL (ref 6.0–8.3)

## 2018-05-18 LAB — CBC WITH DIFFERENTIAL/PLATELET
BASOS ABS: 0.1 10*3/uL (ref 0.0–0.1)
Basophils Relative: 0.8 % (ref 0.0–3.0)
EOS ABS: 0.1 10*3/uL (ref 0.0–0.7)
Eosinophils Relative: 2.2 % (ref 0.0–5.0)
HCT: 43.6 % (ref 36.0–46.0)
Hemoglobin: 14.5 g/dL (ref 12.0–15.0)
LYMPHS ABS: 2.3 10*3/uL (ref 0.7–4.0)
Lymphocytes Relative: 34.9 % (ref 12.0–46.0)
MCHC: 33.3 g/dL (ref 30.0–36.0)
MCV: 85.1 fl (ref 78.0–100.0)
MONO ABS: 0.6 10*3/uL (ref 0.1–1.0)
Monocytes Relative: 9.2 % (ref 3.0–12.0)
NEUTROS PCT: 52.9 % (ref 43.0–77.0)
Neutro Abs: 3.4 10*3/uL (ref 1.4–7.7)
Platelets: 245 10*3/uL (ref 150.0–400.0)
RBC: 5.12 Mil/uL — ABNORMAL HIGH (ref 3.87–5.11)
RDW: 13.4 % (ref 11.5–15.5)
WBC: 6.5 10*3/uL (ref 4.0–10.5)

## 2018-05-18 LAB — LIPID PANEL
CHOL/HDL RATIO: 4
Cholesterol: 226 mg/dL — ABNORMAL HIGH (ref 0–200)
HDL: 61.8 mg/dL (ref >39.00)
LDL Cholesterol: 138 mg/dL — ABNORMAL HIGH (ref 0–99)
NONHDL: 163.75
Triglycerides: 127 mg/dL (ref 0.0–149.0)
VLDL: 25.4 mg/dL (ref 0.0–40.0)

## 2018-05-18 LAB — TSH: TSH: 1.75 u[IU]/mL (ref 0.35–4.50)

## 2018-05-18 NOTE — Progress Notes (Signed)
Subjective:    Patient ID: Taylor Holden, female    DOB: [birth year], 58 y.o.   MRN: [id number]  DOS:  05/18/2018 Type of visit - description: cpx Feeling well  Wt Readings from Last 3 Encounters:  05/18/18 203 lb (92.1 kg)  11/15/17 198 lb 4 oz (89.9 kg)  05/28/17 202 lb (91.6 kg)    Review of Systems   A 14 point review of systems is negative    Past Medical History:  Diagnosis Date  . Allergy   . Early menopause   . Glaucoma   . History of kidney stones   . Hyperlipidemia   . Hypertension   . Vitamin D deficiency     Past Surgical History:  Procedure Laterality Date  . CESAREAN SECTION  04/1998   x1  . CHOLECYSTECTOMY  08/1998  . EYE SURGERY Right 01-2014   for glaucoma    Social History   Socioeconomic History  . Marital status: Married    Spouse name: Not on file  . Number of children: 1  . Years of education: Not on file  . Highest education level: Not on file  Occupational History  . Occupation: RETIRED Occupational hygienist , retiring 2019  Social Needs  . Financial resource strain: Not on file  . Food insecurity:    Worry: Not on file    Inability: Not on file  . Transportation needs:    Medical: Not on file    Non-medical: Not on file  Tobacco Use  . Smoking status: Never Smoker  . Smokeless tobacco: Never Used  Substance and Sexual Activity  . Alcohol use: Yes    Alcohol/week: 0.0 standard drinks    Comment: rare   . Drug use: No  . Sexual activity: Not Currently    Partners: Male    Birth control/protection: Post-menopausal  Lifestyle  . Physical activity:    Days per week: Not on file    Minutes per session: Not on file  . Stress: Not on file  Relationships  . Social connections:    Talks on phone: Not on file    Gets together: Not on file    Attends religious service: Not on file    Active member of club or organization: Not on file    Attends meetings of clubs or organizations: Not on file    Relationship  status: Not on file  . Intimate partner violence:    Fear of current or ex partner: Not on file    Emotionally abused: Not on file    Physically abused: Not on file    Forced sexual activity: Not on file  Other Topics Concern  . Not on file  Social History Narrative   Household: pt, husband    Son 2000, [employer]     Family History  Problem Relation Age of Onset  . Alzheimer's disease Mother   . Diabetes Father   . Hypertension Father   . Stroke Father        aneurysm   . Diabetes Paternal Grandmother   . Heart disease Paternal Grandmother   . Hypertension Paternal Grandmother   . Colon cancer Neg Hx   . Breast cancer Neg Hx   . Stomach cancer Neg Hx      Allergies as of 05/18/2018      Reactions   Cephalexin Anaphylaxis, Hives   Penicillins Hives, Shortness Of Breath, Swelling   Facial swelling  Has patient had a PCN reaction causing immediate  rash, facial/tongue/throat swelling, SOB or lightheadedness with hypotension: Yes Has patient had a PCN reaction causing severe rash involving mucus membranes or skin necrosis: Yes Has patient had a PCN reaction that required hospitalization: No Has patient had a PCN reaction occurring within the last 10 years: No If all of the above answers are "NO", then may proceed with Cephalosporin use.   Prednisone Swelling   Facial swelling and joint swelling       Medication List       Accurate as of May 18, 2018 11:59 PM. Always use your most recent med list.        acetaminophen 500 MG tablet Commonly known as:  TYLENOL Take 1,000 mg by mouth every 8 (eight) hours as needed for mild pain or headache.   latanoprost 0.005 % ophthalmic solution Commonly known as:  XALATAN Place 1 drop into both eyes at bedtime.   losartan 100 MG tablet Commonly known as:  COZAAR Take 1 tablet (100 mg total) by mouth daily.   metoprolol succinate 50 MG 24 hr tablet Commonly known as:  TOPROL-XL Take 1 tablet (50 mg total) by mouth daily. Take  with or immediately following a meal.           Objective:   Physical Exam BP 126/74 (BP Location: Left Arm, Patient Position: Sitting, Cuff Size: Small)   Pulse 74   Temp 98 F (36.7 C) (Oral)   Resp 16   Ht 5\' 4"  (1.626 m)   Wt 203 lb (92.1 kg)   SpO2 98%   BMI 34.84 kg/m  General: Well developed, NAD, BMI noted Neck: No  thyromegaly  HEENT:  Normocephalic . Face symmetric, atraumatic Lungs:  CTA B Normal respiratory effort, no intercostal retractions, no accessory muscle use. Heart: RRR,  no murmur.  No pretibial edema bilaterally  Abdomen:  Not distended, soft, non-tender. No rebound or rigidity.   Skin: Exposed areas without rash. Not pale. Not jaundice Neurologic:  alert & oriented X3.  Speech normal, gait appropriate for age and unassisted Strength symmetric and appropriate for age.  Psych: Cognition and judgment appear intact.  Cooperative with normal attention span and concentration.  Behavior appropriate. No anxious or depressed appearing.     Assessment     Assessment  New pt to me 10-02-15 HTN -- rx meds 2001; in the past hctz caused low K Obesity Hyperlipidemia Mildly elevated LFTs.  Saw GI 03/2017, additional labs done: normal/negative.  [place] c/w  fatty liver; liver Bx 05/2017: Steatohepatitis Glaucoma  Early menopause, onset 2011 Vitamin D deficiency Allergies H/o  kidney stones  PLAN HTN: Controlled, continue losartan, metoprolol. Hyperlipidemia: Diet controlled, recheck today. History of vitamin D deficiency: Not on supplements, rec 1000 units daily RTC 1 year.

## 2018-05-18 NOTE — Patient Instructions (Addendum)
Happy belated Rudene Anda!  GO TO THE LAB : Get the blood work     GO TO THE FRONT DESK Schedule your next appointment   for a physical exam in 1 year  Check the  blood pressure monthly   Be sure your blood pressure is between 110/65 and  135/85. If it is consistently higher or lower, let me know  Vitamin D OTC: 1000 units daily

## 2018-05-18 NOTE — Progress Notes (Signed)
Pre visit review using our clinic review tool, if applicable. No additional management support is needed unless otherwise documented below in the visit note. 

## 2018-05-18 NOTE — Assessment & Plan Note (Addendum)
-  Td 2012. Had a flu shot. Shingrix # 1 today, RTC 2 months -Female care: Dr Corinna Capra , not seen in years, referral sent Also refer for a DEXA and   MMG  -CCS: Cscope 2014 in [place] (@[address]) Dr Reader who pass away, unable to get records.  We do not know if she had polyps are known.  Plan: Refer to GI for consideration of another colonoscopy.  If unable to proceed, will do Hemoccults. -Labs: CMP, FLP, CBC, TSH -Diet: Trying to do better but unable to lose weight, recommend to consider the wellness center. -Exercise: Doing some walking.  Recommend to focus on diet/weight loss before she increases her exercise.

## 2018-05-19 ENCOUNTER — Encounter: Payer: BC Managed Care – PPO | Admitting: Internal Medicine

## 2018-05-19 NOTE — Assessment & Plan Note (Signed)
HTN: Controlled, continue losartan, metoprolol. Hyperlipidemia: Diet controlled, recheck today. History of vitamin D deficiency: Not on supplements, rec 1000 units daily RTC 1 year.

## 2018-05-23 ENCOUNTER — Ambulatory Visit (HOSPITAL_BASED_OUTPATIENT_CLINIC_OR_DEPARTMENT_OTHER)
Admission: RE | Admit: 2018-05-23 | Discharge: 2018-05-23 | Disposition: A | Discharge status: Home / Self Care | Payer: BC Managed Care – PPO | Source: Ambulatory Visit | Attending: Internal Medicine | Admitting: Internal Medicine

## 2018-05-23 ENCOUNTER — Encounter (HOSPITAL_BASED_OUTPATIENT_CLINIC_OR_DEPARTMENT_OTHER): Payer: Self-pay

## 2018-05-23 DIAGNOSIS — Z1231 Encounter for screening mammogram for malignant neoplasm of breast: Secondary | ICD-10-CM | POA: Insufficient documentation

## 2018-05-23 DIAGNOSIS — Z780 Asymptomatic menopausal state: Secondary | ICD-10-CM | POA: Diagnosis not present

## 2018-05-24 ENCOUNTER — Ambulatory Visit: Payer: Self-pay

## 2018-05-24 ENCOUNTER — Encounter: Payer: Self-pay | Admitting: Family

## 2018-05-24 ENCOUNTER — Ambulatory Visit: Payer: BC Managed Care – PPO | Admitting: Family

## 2018-05-24 VITALS — BP 138/81 | HR 67 | Temp 98.0°F | Resp 16 | Ht 64.0 in | Wt 199.0 lb

## 2018-05-24 DIAGNOSIS — R195 Other fecal abnormalities: Secondary | ICD-10-CM | POA: Diagnosis not present

## 2018-05-24 DIAGNOSIS — K5792 Diverticulitis of intestine, part unspecified, without perforation or abscess without bleeding: Secondary | ICD-10-CM

## 2018-05-24 LAB — CBC WITH DIFFERENTIAL/PLATELET
Basophils Absolute: 0.1 10*3/uL (ref 0.0–0.1)
Basophils Relative: 1.1 % (ref 0.0–3.0)
Eosinophils Absolute: 0.1 10*3/uL (ref 0.0–0.7)
Eosinophils Relative: 2.4 % (ref 0.0–5.0)
HCT: 44 % (ref 36.0–46.0)
Hemoglobin: 14.4 g/dL (ref 12.0–15.0)
LYMPHS ABS: 1.6 10*3/uL (ref 0.7–4.0)
LYMPHS PCT: 28.3 % (ref 12.0–46.0)
MCHC: 32.8 g/dL (ref 30.0–36.0)
MCV: 84.9 fl (ref 78.0–100.0)
MONOS PCT: 11 % (ref 3.0–12.0)
Monocytes Absolute: 0.6 10*3/uL (ref 0.1–1.0)
NEUTROS PCT: 57.2 % (ref 43.0–77.0)
Neutro Abs: 3.2 10*3/uL (ref 1.4–7.7)
Platelets: 248 10*3/uL (ref 150.0–400.0)
RBC: 5.18 Mil/uL — ABNORMAL HIGH (ref 3.87–5.11)
RDW: 13 % (ref 11.5–15.5)
WBC: 5.5 10*3/uL (ref 4.0–10.5)

## 2018-05-24 NOTE — Patient Instructions (Signed)
Please call if your abdominal pain worsens, if you develop fever >101 of if symptoms do not continue to improve in the next 5 days. Please keep your upcoming appointment with Dr. Fuller Plan.  Complete lab work prior to leaving.

## 2018-05-24 NOTE — Addendum Note (Signed)
Addended byDamita Dunnings D on: 05/24/2018 04:19 PM   Modules accepted: Orders

## 2018-05-24 NOTE — Telephone Encounter (Signed)
Pt called to say that Friday she was seen at urgent care for abdominal pain.  She was sent for a CT of the abdomin and told she had an inflamed colon and was placed on antibiotics. She is calling today because she is constipated passing small amounts of stool that are black.  She states she still has abdominal pain both sides.  She has an appointment with Dr Fuller Plan GI in a few weeks but is scared because of her black stool.  Appointment scheduled per protocol.  Care advice read to patient. Pt verbalized understanding of all instructions.  Reason for Disposition . [1] Abnormal color is unexplained AND [2] persists > 24 hours  Answer Assessment - Initial Assessment Questions 1. COLOR: "What color is it?" "Is that color in part or all of the stool?"     Black stool 2. ONSET: "When was the unusual color first noted?"     Late Friday night 3. CAUSE: "Have you eaten any food or taken any medicine of this color?" (See listing in BACKGROUND)    cipro 4. OTHER SYMPTOMS: "Do you have any other symptoms?" (e.g., diarrhea, jaundice, abdominal pain, fever).    Fatigue, left side  And right side pain, constipation, BM 2 hours ago  Protocols used: STOOLS - UNUSUAL COLOR-A-AH

## 2018-05-24 NOTE — Progress Notes (Signed)
Subjective:    Patient ID: Taylor Holden, female    DOB: 1960-05-27, 58 y.o.   MRN: [id number]  HPI  Patient is a 58 yr old female with recently diagnosed diverticulitis, who presents with chief complaint of dark stool.  She went to urgent care on 05/20/18 with LLQ pain (which began on 2/6) and was diagnosed with diverticulitis by CT. She was treated with cipro/flagyl. (records reviewed in care everywhere).Reports that dark stool began on  began on Friday night 05/20/18. She reports a loose stool this AM. Denies NSAID use.  [facility] 2/[facility]:   1. Minimal inflammatory stranding adjacent to the distal descending colon without an inflamed diverticulum evident. 2. No other evidence of acute abnormality in the abdomen or pelvis. 3. 2.6 cm right adrenal adenoma. 4. Aortic Atherosclerosis (ICD10-I70.0).   Reports that LLQ pain started Thursday and dark stool started Friday night.    Review of Systems See HPI  Past Medical History:  Diagnosis Date  . Allergy   . Early menopause   . Glaucoma   . History of kidney stones   . Hyperlipidemia   . Hypertension   . Vitamin D deficiency      Social History   Socioeconomic History  . Marital status: Married    Spouse name: Not on file  . Number of children: 1  . Years of education: Not on file  . Highest education level: Not on file  Occupational History  . Occupation: RETIRED Occupational hygienist , retiring 2019  Social Needs  . Financial resource strain: Not on file  . Food insecurity:    Worry: Not on file    Inability: Not on file  . Transportation needs:    Medical: Not on file    Non-medical: Not on file  Tobacco Use  . Smoking status: Never Smoker  . Smokeless tobacco: Never Used  Substance and Sexual Activity  . Alcohol use: Yes    Alcohol/week: 0.0 standard drinks    Comment: rare   . Drug use: No  . Sexual activity: Not Currently    Partners: Male    Birth control/protection: Post-menopausal    Lifestyle  . Physical activity:    Days per week: Not on file    Minutes per session: Not on file  . Stress: Not on file  Relationships  . Social connections:    Talks on phone: Not on file    Gets together: Not on file    Attends religious service: Not on file    Active member of club or organization: Not on file    Attends meetings of clubs or organizations: Not on file    Relationship status: Not on file  . Intimate partner violence:    Fear of current or ex partner: Not on file    Emotionally abused: Not on file    Physically abused: Not on file    Forced sexual activity: Not on file  Other Topics Concern  . Not on file  Social History Narrative   Household: pt, husband    Son 2000, [place]    Past Surgical History:  Procedure Laterality Date  . CESAREAN SECTION  04/1998   x1  . CHOLECYSTECTOMY  08/1998  . EYE SURGERY Right 01-2014   for glaucoma    Family History  Problem Relation Age of Onset  . Alzheimer's disease Mother   . Diabetes Father   . Hypertension Father   . Stroke Father  aneurysm   . Diabetes Paternal Grandmother   . Heart disease Paternal Grandmother   . Hypertension Paternal Grandmother   . Colon cancer Neg Hx   . Breast cancer Neg Hx   . Stomach cancer Neg Hx     Allergies  Allergen Reactions  . Cephalexin Anaphylaxis and Hives  . Penicillins Hives, Shortness Of Breath and Swelling    Facial swelling  Has patient had a PCN reaction causing immediate rash, facial/tongue/throat swelling, SOB or lightheadedness with hypotension: Yes Has patient had a PCN reaction causing severe rash involving mucus membranes or skin necrosis: Yes Has patient had a PCN reaction that required hospitalization: No Has patient had a PCN reaction occurring within the last 10 years: No If all of the above answers are "NO", then may proceed with Cephalosporin use.   . Prednisone Swelling    Facial swelling and joint swelling     Current Outpatient  Medications on File Prior to Visit  Medication Sig Dispense Refill  . acetaminophen (TYLENOL) 500 MG tablet Take 1,000 mg by mouth every 8 (eight) hours as needed for mild pain or headache.    . latanoprost (XALATAN) 0.005 % ophthalmic solution Place 1 drop into both eyes at bedtime.    Marland Kitchen losartan (COZAAR) 100 MG tablet Take 1 tablet (100 mg total) by mouth daily. 90 tablet 1  . metoprolol succinate (TOPROL-XL) 50 MG 24 hr tablet Take 1 tablet (50 mg total) by mouth daily. Take with or immediately following a meal. 90 tablet 2   No current facility-administered medications on file prior to visit.     BP 138/81 (BP Location: Right Arm, Patient Position: Sitting, Cuff Size: Small)   Pulse 67   Temp 98 F (36.7 C) (Oral)   Resp 16   Ht 5\' 4"  (1.626 m)   Wt 199 lb (90.3 kg)   SpO2 98%   BMI 34.16 kg/m       Objective:   Physical Exam Constitutional:      Appearance: She is well-developed.  Neck:     Musculoskeletal: Neck supple.     Thyroid: No thyromegaly.  Cardiovascular:     Rate and Rhythm: Normal rate and regular rhythm.     Heart sounds: Normal heart sounds. No murmur.  Pulmonary:     Effort: Pulmonary effort is normal. No respiratory distress.     Breath sounds: Normal breath sounds. No wheezing.  Abdominal:     General: Bowel sounds are normal. There is no distension.     Palpations: Abdomen is soft.     Tenderness: There is abdominal tenderness in the left upper quadrant and left lower quadrant. There is no guarding.  Genitourinary:    Rectum: Normal. Guaiac result negative.  Skin:    General: Skin is warm and dry.  Neurological:     Mental Status: She is alert and oriented to person, place, and time.  Psychiatric:        Behavior: Behavior normal.        Thought Content: Thought content normal.        Judgment: Judgment normal.           Assessment & Plan:  Diverticulitis-  Showing some clinical improvement. Will obtain CBC to assess white count.  Advised pt to continue current abx and to call if symptoms worsen or if they do not continue to improve over the next 5 days.   Dark stool- Stool is heme negative today. ? Secondary to abx.  Pt is advised to keep her upcoming appointment with GI. Will check cbc to rule out anemia.

## 2018-06-09 ENCOUNTER — Ambulatory Visit: Payer: BC Managed Care – PPO | Admitting: Gastroenterology

## 2018-06-09 ENCOUNTER — Encounter: Payer: Self-pay | Admitting: Gastroenterology

## 2018-06-09 VITALS — BP 140/90 | HR 72 | Ht 63.0 in | Wt 200.0 lb

## 2018-06-09 DIAGNOSIS — K5732 Diverticulitis of large intestine without perforation or abscess without bleeding: Secondary | ICD-10-CM | POA: Diagnosis not present

## 2018-06-09 DIAGNOSIS — K7581 Nonalcoholic steatohepatitis (NASH): Secondary | ICD-10-CM

## 2018-06-09 DIAGNOSIS — R933 Abnormal findings on diagnostic imaging of other parts of digestive tract: Secondary | ICD-10-CM

## 2018-06-09 MED ORDER — NA SULFATE-K SULFATE-MG SULF 17.5-3.13-1.6 GM/177ML PO SOLN: 1.0000 | Freq: Once | ORAL | 0 refills | Status: AC

## 2018-06-09 NOTE — Patient Instructions (Signed)
You have been scheduled for a colonoscopy. Please follow written instructions given to you at your visit today.  Please pick up your prep supplies at the pharmacy within the next 1-3 days. If you use inhalers (even only as needed), please bring them with you on the day of your procedure. Your physician has requested that you go to www.startemmi.com and enter the access code given to you at your visit today. This web site gives a general overview about your procedure. However, you should still follow specific instructions given to you by our office regarding your preparation for the procedure.  You have been given a high fiber diet to follow.   Normal BMI (Body Mass Index- based on height and weight) is between 19 and 25. Your BMI today is Body mass index is 35.43 kg/m. Marland Kitchen Please consider follow up  regarding your BMI with your Primary Care Provider.  Thank you for choosing me and [facility].  Pricilla Riffle. Dagoberto Ligas., MD., Marval Regal

## 2018-06-09 NOTE — Progress Notes (Signed)
    History of Present Illness: This is a 58 year old female with recent LLQ pain and acute diverticulitis diagnosed by CT. She was treated with a course of Cipro and Flagyl and her symptoms have completed resolved. She reports having a colonoscopy at [place] in 2014 that was normal however we do not have the records. At this time she denies weight loss, abdominal pain, constipation, diarrhea, change in stool caliber, melena, hematochezia, nausea, vomiting, dysphagia, reflux symptoms, chest pain.  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  Physical Exam: General: Well developed, well nourished, no acute distress Head: Normocephalic and atraumatic Eyes:  sclerae anicteric, EOMI Ears: Normal auditory acuity Mouth: No deformity or lesions Lungs: Clear throughout to auscultation Heart: Regular rate and rhythm; no murmurs, rubs or bruits Abdomen: Soft, non tender and non distended. No masses, hepatosplenomegaly or hernias noted. Normal Bowel sounds Rectal: Deferred to colonoscopy Musculoskeletal: Symmetrical with no gross deformities  Pulses:  Normal pulses noted Extremities: No clubbing, cyanosis, edema or deformities noted Neurological: Alert oriented x 4, grossly nonfocal Psychological:  Alert and cooperative. Normal mood and affect   Assessment and Recommendations:  1. Acute diverticulitis, resolved. Abnormal CT of the colon c/w diverticulitis. R/O neoplasm, colitis. Schedule colonoscopy. The risks (including bleeding, perforation, infection, missed lesions, medication reactions and possible hospitalization or surgery if complications occur), benefits, and  alternatives to colonoscopy with possible biopsy and possible polypectomy were discussed with the patient and they consent to proceed.   2. Steatohepatitis with mild fibrosis. BMI=35. Strongly encouraged a long term weight loss program to achieve BMI < 25 supervised by her PCP and appropriate control of hyperlipidemia. Recheck LFTs.

## 2018-06-20 LAB — HM PAP SMEAR

## 2018-07-06 ENCOUNTER — Other Ambulatory Visit: Payer: Self-pay | Admitting: Internal Medicine

## 2018-07-07 ENCOUNTER — Telehealth: Payer: Self-pay

## 2018-07-07 NOTE — Telephone Encounter (Signed)
Covid-19 travel screening questions  Have you traveled in the last 14 days? No. If yes where?  Do you now or have you had a fever in the last 14 days? No.  Do you have any respiratory symptoms of shortness of breath or cough now or in the last 14 days? No.  Do you have a medical history of Congestive Heart Failure?  Do you have a medical history of lung disease?  Do you have any family members or close contacts with diagnosed or suspected Covid-19? No.      Called patient and was able to move patients procedure time up as well as explained that she would have a different MD. She completely understood and was grateful that we were not canceling her. I also explained our "No care partner in the lobby" policy and she agreed and understood. After giving her new prep instructions she understood and had no further questions.

## 2018-07-08 ENCOUNTER — Encounter: Payer: Self-pay | Admitting: Gastroenterology

## 2018-07-08 ENCOUNTER — Other Ambulatory Visit: Payer: Self-pay

## 2018-07-08 ENCOUNTER — Ambulatory Visit (AMBULATORY_SURGERY_CENTER): Payer: BC Managed Care – PPO | Admitting: Gastroenterology

## 2018-07-08 VITALS — BP 123/73 | HR 71 | Temp 97.8°F | Resp 10 | Ht 63.0 in | Wt 200.0 lb

## 2018-07-08 DIAGNOSIS — D127 Benign neoplasm of rectosigmoid junction: Secondary | ICD-10-CM

## 2018-07-08 DIAGNOSIS — D122 Benign neoplasm of ascending colon: Secondary | ICD-10-CM

## 2018-07-08 DIAGNOSIS — K5732 Diverticulitis of large intestine without perforation or abscess without bleeding: Secondary | ICD-10-CM | POA: Diagnosis not present

## 2018-07-08 DIAGNOSIS — D124 Benign neoplasm of descending colon: Secondary | ICD-10-CM | POA: Diagnosis not present

## 2018-07-08 MED ORDER — SODIUM CHLORIDE 0.9 % IV SOLN: 500.0000 mL | Freq: Once | INTRAVENOUS | Status: DC

## 2018-07-08 NOTE — Progress Notes (Signed)
To PACU, vss. Report to [email or web address] 

## 2018-07-08 NOTE — Patient Instructions (Signed)
Handouts given for Diverticulosis, Hemorrhoids and Polyps.  YOU HAD AN ENDOSCOPIC PROCEDURE TODAY AT [facility]:   Refer to the procedure report that was given to you for any specific questions about what was found during the examination.  If the procedure report does not answer your questions, please call your gastroenterologist to clarify.  If you requested that your care partner not be given the details of your procedure findings, then the procedure report has been included in a sealed envelope for you to review at your convenience later.  YOU SHOULD EXPECT: Some feelings of bloating in the abdomen. Passage of more gas than usual.  Walking can help get rid of the air that was put into your GI tract during the procedure and reduce the bloating. If you had a lower endoscopy (such as a colonoscopy or flexible sigmoidoscopy) you may notice spotting of blood in your stool or on the toilet paper. If you underwent a bowel prep for your procedure, you may not have a normal bowel movement for a few days.  Please Note:  You might notice some irritation and congestion in your nose or some drainage.  This is from the oxygen used during your procedure.  There is no need for concern and it should clear up in a day or so.  SYMPTOMS TO REPORT IMMEDIATELY:   Following lower endoscopy (colonoscopy or flexible sigmoidoscopy):  Excessive amounts of blood in the stool  Significant tenderness or worsening of abdominal pains  Swelling of the abdomen that is new, acute  Fever of 100F or higher   For urgent or emergent issues, a gastroenterologist can be reached at any hour by calling [phone].   DIET:  We do recommend a small meal at first, but then you may proceed to your regular diet.  Drink plenty of fluids but you should avoid alcoholic beverages for 24 hours.  ACTIVITY:  You should plan to take it easy for the rest of today and you should NOT DRIVE or use heavy machinery until  tomorrow (because of the sedation medicines used during the test).    FOLLOW UP: Our staff will call the number listed on your records the next business day following your procedure to check on you and address any questions or concerns that you may have regarding the information given to you following your procedure. If we do not reach you, we will leave a message.  However, if you are feeling well and you are not experiencing any problems, there is no need to return our call.  We will assume that you have returned to your regular daily activities without incident.  If any biopsies were taken you will be contacted by phone or by letter within the next 1-3 weeks.  Please call us at [phone] if you have not heard about the biopsies in 3 weeks.    SIGNATURES/CONFIDENTIALITY: You and/or your care partner have signed paperwork which will be entered into your electronic medical record.  These signatures attest to the fact that that the information above on your After Visit Summary has been reviewed and is understood.  Full responsibility of the confidentiality of this discharge information lies with you and/or your care-partner.

## 2018-07-08 NOTE — Op Note (Signed)
[facility] Patient Name: Taylor Holden Procedure Date: 07/08/2018 8:31 AM MRN: [id number] Endoscopist: Justice Britain , MD Age: 58 Referring MD:  Date of Birth: [birth year] Gender: Female Account #: [id number] Procedure:                Colonoscopy Indications:              Evaluation of abnormal imaging study (likely to be                            clinically significant), Suspected diverticulitis,                            Follow-up of diverticulitis (CT imaging report from                            OSH suggested colitis but no overt diverticulitis) Medicines:                Monitored Anesthesia Care Procedure:                Pre-Anesthesia Assessment:                           - Prior to the procedure, a History and Physical                            was performed, and patient medications and                            allergies were reviewed. The patient's tolerance of                            previous anesthesia was also reviewed. The risks                            and benefits of the procedure and the sedation                            options and risks were discussed with the patient.                            All questions were answered, and informed consent                            was obtained. Prior Anticoagulants: The patient has                            taken no previous anticoagulant or antiplatelet                            agents. ASA Grade Assessment: III - A patient with                            severe systemic disease. After reviewing the risks  and benefits, the patient was deemed in                            satisfactory condition to undergo the procedure.                           After obtaining informed consent, the colonoscope                            was passed under direct vision. Throughout the                            procedure, the patient's blood pressure, pulse, and   oxygen saturations were monitored continuously. The                            Colonoscope was introduced through the anus and                            advanced to the 3 cm into the ileum. The                            colonoscopy was performed without difficulty. The                            patient tolerated the procedure. The quality of the                            bowel preparation was good. The terminal ileum,                            ileocecal valve, appendiceal orifice, and rectum                            were photographed. Scope In: 8:40:49 AM Scope Out: 8:58:20 AM Scope Withdrawal Time: 0 hours 14 minutes 48 seconds  Total Procedure Duration: 0 hours 17 minutes 31 seconds  Findings:                 The digital rectal exam findings include                            non-thrombosed external hemorrhoids. Pertinent                            negatives include no palpable rectal lesions.                           The terminal ileum and ileocecal valve appeared                            normal.                           Four sessile polyps were found in the recto-sigmoid  colon (2), descending colon (1) and ascending colon                            (1). The polyps were 2 to 5 mm in size. These                            polyps were removed with a cold snare. Resection                            and retrieval were complete.                           A single inverted, small-mouthed diverticulum was                            found in the proximal descending colon.                           Normal mucosa was found in the entire colon                            otherwise.                           Non-bleeding non-thrombosed external and internal                            hemorrhoids were found during retroflexion, during                            perianal exam and during digital exam. The                            hemorrhoids were Grade I  (internal hemorrhoids that                            do not prolapse). Complications:            No immediate complications. Estimated Blood Loss:     Estimated blood loss was minimal. Impression:               - Non-thrombosed external hemorrhoids found on                            digital rectal exam.                           - The examined portion of the ileum was normal.                           - Four 2 to 5 mm polyps at the recto-sigmoid colon,                            in the descending colon and in the ascending colon,  removed with a cold snare. Resected and retrieved.                           - Single diverticulum in the proximal descending                            colon.                           - Normal mucosa in the entire examined colon                            otherwise.                           - Non-bleeding non-thrombosed external and internal                            hemorrhoids. Recommendation:           - The patient will be observed post-procedure,                            until all discharge criteria are met.                           - Discharge patient to home.                           - Patient has a contact number available for                            emergencies. The signs and symptoms of potential                            delayed complications were discussed with the                            patient. Return to normal activities tomorrow.                            Written discharge instructions were provided to the                            patient.                           - Resume previous diet.                           - Continue present medications.                           - Await pathology results.                           - Repeat colonoscopy in 3 - 5 years for  surveillance based on pathology results and                            findings of adenomatous tissue.                            - The findings and recommendations were discussed                            with the patient. Justice Britain, MD 07/08/2018 9:06:47 AM

## 2018-07-08 NOTE — Progress Notes (Signed)
Called to room to assist during endoscopic procedure.  Patient ID and intended procedure confirmed with present staff. Received instructions for my participation in the procedure from the performing physician.  

## 2018-07-11 ENCOUNTER — Telehealth: Payer: Self-pay | Admitting: *Deleted

## 2018-07-11 NOTE — Telephone Encounter (Signed)
  Follow up Call-  Call back number 07/08/2018  Post procedure Call Back phone  # [phone]  Permission to leave phone message Yes  Some recent data might be hidden     Patient questions:  Do you have a fever, pain , or abdominal swelling? No. Pain Score  0 *  Have you tolerated food without any problems? Yes.    Have you been able to return to your normal activities? Yes.    Do you have any questions about your discharge instructions: Diet   No. Medications  No. Follow up visit  No.  Do you have questions or concerns about your Care? No.  Actions: * If pain score is 4 or above: No action needed, pain <4.

## 2018-07-15 ENCOUNTER — Encounter: Payer: Self-pay | Admitting: Gastroenterology

## 2018-07-22 ENCOUNTER — Ambulatory Visit: Payer: BC Managed Care – PPO

## 2018-07-26 ENCOUNTER — Other Ambulatory Visit: Payer: Self-pay

## 2018-07-26 ENCOUNTER — Ambulatory Visit (INDEPENDENT_AMBULATORY_CARE_PROVIDER_SITE_OTHER): Payer: BC Managed Care – PPO | Admitting: *Deleted

## 2018-07-26 DIAGNOSIS — Z23 Encounter for immunization: Secondary | ICD-10-CM | POA: Diagnosis not present

## 2018-07-26 NOTE — Patient Instructions (Signed)
Patient presented to office for second Shingrix vaccine injection, no current issues, tolerated injection well/SLS 04/14

## 2018-11-23 ENCOUNTER — Other Ambulatory Visit: Payer: Self-pay | Admitting: Internal Medicine

## 2019-01-10 ENCOUNTER — Other Ambulatory Visit: Payer: Self-pay | Admitting: Internal Medicine

## 2019-01-23 ENCOUNTER — Encounter: Payer: Self-pay | Admitting: Internal Medicine

## 2019-05-24 ENCOUNTER — Encounter: Payer: Self-pay | Admitting: Internal Medicine

## 2019-05-24 ENCOUNTER — Ambulatory Visit (INDEPENDENT_AMBULATORY_CARE_PROVIDER_SITE_OTHER): Payer: BC Managed Care – PPO | Admitting: Internal Medicine

## 2019-05-24 ENCOUNTER — Other Ambulatory Visit: Payer: Self-pay

## 2019-05-24 VITALS — BP 136/65 | HR 83 | Temp 96.1°F | Resp 16 | Ht 63.0 in | Wt 206.5 lb

## 2019-05-24 DIAGNOSIS — Z0000 Encounter for general adult medical examination without abnormal findings: Secondary | ICD-10-CM | POA: Diagnosis not present

## 2019-05-24 LAB — COMPREHENSIVE METABOLIC PANEL
ALT: 41 U/L — ABNORMAL HIGH (ref 0–35)
AST: 29 U/L (ref 0–37)
Albumin: 4.2 g/dL (ref 3.5–5.2)
Alkaline Phosphatase: 93 U/L (ref 39–117)
BUN: 12 mg/dL (ref 6–23)
CO2: 29 mEq/L (ref 19–32)
Calcium: 9.6 mg/dL (ref 8.4–10.5)
Chloride: 104 mEq/L (ref 96–112)
Creatinine, Ser: 0.98 mg/dL (ref 0.40–1.20)
GFR: 58.08 mL/min — ABNORMAL LOW (ref >60.00)
Glucose, Bld: 96 mg/dL (ref 70–99)
Potassium: 4.3 mEq/L (ref 3.5–5.1)
Sodium: 139 mEq/L (ref 135–145)
Total Bilirubin: 0.6 mg/dL (ref 0.2–1.2)
Total Protein: 7.1 g/dL (ref 6.0–8.3)

## 2019-05-24 LAB — CBC WITH DIFFERENTIAL/PLATELET
Basophils Absolute: 0.1 10*3/uL (ref 0.0–0.1)
Basophils Relative: 1 % (ref 0.0–3.0)
Eosinophils Absolute: 0.1 10*3/uL (ref 0.0–0.7)
Eosinophils Relative: 2.4 % (ref 0.0–5.0)
HCT: 42.5 % (ref 36.0–46.0)
Hemoglobin: 14.1 g/dL (ref 12.0–15.0)
Lymphocytes Relative: 29.3 % (ref 12.0–46.0)
Lymphs Abs: 1.7 10*3/uL (ref 0.7–4.0)
MCHC: 33 g/dL (ref 30.0–36.0)
MCV: 85.1 fl (ref 78.0–100.0)
Monocytes Absolute: 0.5 10*3/uL (ref 0.1–1.0)
Monocytes Relative: 8.6 % (ref 3.0–12.0)
Neutro Abs: 3.4 10*3/uL (ref 1.4–7.7)
Neutrophils Relative %: 58.7 % (ref 43.0–77.0)
Platelets: 245 10*3/uL (ref 150.0–400.0)
RBC: 5 Mil/uL (ref 3.87–5.11)
RDW: 13.3 % (ref 11.5–15.5)
WBC: 5.8 10*3/uL (ref 4.0–10.5)

## 2019-05-24 LAB — LIPID PANEL
Cholesterol: 208 mg/dL — ABNORMAL HIGH (ref 0–200)
HDL: 61 mg/dL (ref >39.00)
LDL Cholesterol: 117 mg/dL — ABNORMAL HIGH (ref 0–99)
NonHDL: 146.64
Total CHOL/HDL Ratio: 3
Triglycerides: 147 mg/dL (ref 0.0–149.0)
VLDL: 29.4 mg/dL (ref 0.0–40.0)

## 2019-05-24 MED ORDER — LOSARTAN POTASSIUM 100 MG PO TABS: 100.0000 mg | ORAL_TABLET | Freq: Every day | ORAL | 2 refills | Status: DC

## 2019-05-24 MED ORDER — METOPROLOL SUCCINATE ER 50 MG PO TB24: 50.0000 mg | ORAL_TABLET | Freq: Every day | ORAL | 2 refills | Status: DC

## 2019-05-24 NOTE — Progress Notes (Signed)
Subjective:    Patient ID: Taylor Holden, female    DOB: 1960-12-22, 59 y.o.   MRN: [id number]  DOS:  05/24/2019 Type of visit - description:  CPX Since the last office visit, had a episode of diverticulitis, had a colonoscopy.  No symptoms since  Review of Systems  Other than above, a 14 point review of systems is negative     Past Medical History:  Diagnosis Date  . Adrenal adenoma   . Allergy   . Aortic atherosclerosis ([employer])   . Arthritis   . Early menopause   . Fatty liver   . Glaucoma   . History of kidney stones   . Hyperlipidemia   . Hypertension   . Vitamin D deficiency     Past Surgical History:  Procedure Laterality Date  . CESAREAN SECTION  04/1998   x1  . CHOLECYSTECTOMY  08/1998  . EYE SURGERY Right 01-2014   for glaucoma  . WISDOM TOOTH EXTRACTION     Family History  Problem Relation Age of Onset  . Alzheimer's disease Mother   . Diabetes Father   . Hypertension Father   . Stroke Father        aneurysm   . Diabetes Paternal Grandmother   . Heart disease Paternal Grandmother   . Hypertension Paternal Grandmother   . Colon cancer Neg Hx   . Breast cancer Neg Hx   . Stomach cancer Neg Hx   . Esophageal cancer Neg Hx   . Pancreatic cancer Neg Hx      Allergies as of 05/24/2019      Reactions   Cephalexin Anaphylaxis, Hives   Penicillins Hives, Shortness Of Breath, Swelling   Facial swelling  Has patient had a PCN reaction causing immediate rash, facial/tongue/throat swelling, SOB or lightheadedness with hypotension: Yes Has patient had a PCN reaction causing severe rash involving mucus membranes or skin necrosis: Yes Has patient had a PCN reaction that required hospitalization: No Has patient had a PCN reaction occurring within the last 10 years: No If all of the above answers are "NO", then may proceed with Cephalosporin use.   Prednisone Swelling   Facial swelling and joint swelling       Medication List       Accurate as of May 24, 2019 11:59 PM. If you have any questions, ask your nurse or doctor.        acetaminophen 500 MG tablet Commonly known as: TYLENOL Take 1,000 mg by mouth every 8 (eight) hours as needed for mild pain or headache.   CENTRUM SILVER 50+WOMEN PO Take by mouth.   latanoprost 0.005 % ophthalmic solution Commonly known as: XALATAN Place 1 drop into both eyes at bedtime.   losartan 100 MG tablet Commonly known as: COZAAR Take 1 tablet (100 mg total) by mouth daily.   metoprolol succinate 50 MG 24 hr tablet Commonly known as: TOPROL-XL Take 1 tablet (50 mg total) by mouth daily. Take with or immediately following a meal.   VITAMIN D3 PO Take 1 tablet by mouth daily.        Objective:   Physical Exam BP 136/65 (BP Location: Left Arm, Patient Position: Sitting, Cuff Size: Normal)   Pulse 83   Temp (!) 96.1 F (35.6 C) (Temporal)   Resp 16   Ht 5' 3"  (1.6 m)   Wt 206 lb 8 oz (93.7 kg)   SpO2 96%   BMI 36.58 kg/m   General: Well developed, NAD,  BMI noted Neck: No  thyromegaly  HEENT:  Normocephalic . Face symmetric, atraumatic Lungs:  CTA B Normal respiratory effort, no intercostal retractions, no accessory muscle use. Heart: RRR,  no murmur.  No pretibial edema bilaterally  Abdomen:  Not distended, soft, non-tender. No rebound or rigidity.   Skin: Exposed areas without rash. Not pale. Not jaundice Neurologic:  alert & oriented X3.  Speech normal, gait appropriate for age and unassisted Strength symmetric and appropriate for age.  Psych: Cognition and judgment appear intact.  Cooperative with normal attention span and concentration.  Behavior appropriate. No anxious or depressed appearing.     Assessment      Assessment  New pt to me 10-02-15 HTN -- rx meds 2001; in the past hctz caused low K Obesity Hyperlipidemia Mildly elevated LFTs.  Saw GI 03/2017, additional labs done: normal/negative.  [place] c/w  fatty liver; liver Bx 05/2017: Steatohepatitis Glaucoma   Early menopause, onset 2011 Vitamin D deficiency Allergies H/o  kidney stones  PLAN Here for CPX HTN: Continue losartan, metoprolol, monitor BPs at home.  Checking appropriate labs Hyperlipidemia: Diet controlled, CV RF calculated at 4.3%. Obesity: Diet and exercise discussed Vitamin D deficiency: Recommend OTCs RTC 1 year  This visit occurred during the SARS-CoV-2 public health emergency.  Safety protocols were in place, including screening questions prior to the visit, additional usage of staff PPE, and extensive cleaning of exam room while observing appropriate contact time as indicated for disinfecting solutions.

## 2019-05-24 NOTE — Patient Instructions (Signed)
GO TO THE LAB : Get the blood work     [place] back for a physical exam in one year , please make an appointment   Check the  blood pressure 2   times a month  BP GOAL is between 110/65 and  135/85. If it is consistently higher or lower, let me know    We are committed to keeping you informed about the COVID-19 vaccine.  As the vaccine continues to become available for each phase, we will ensure that patients who meet the criteria receive the information they need to access vaccination opportunities. Continue to check your MyChart account and [email or web address] for updates. Please review the Phase 1b information below.  [place] On Tuesday, Jan. 19, the [facility]) and [place] began large-scale COVID-19 vaccinations at the [facility]. The vaccinations are appointment only and for those 62 and older.  Walk-ins will not be accepted.  All appointments are currently filled. Please join our waiting list for the next available appointments. We will contact you when appointments become available. Please do not sign up more than once.  Join Our Waiting List  Our daily vaccination capacity will continue to increase, including expansion of our [employer] site, increasing mobile clinics across our service region and plans to open COVID-19 vaccination clinics in [place] and [place] counties.  Other Vaccination Opportunities in [place] We are also working in partnership with county health agencies in our service counties to ensure continuing vaccination availability in the weeks and months ahead. Learn more about each county's vaccination efforts in the website links below:   [place]'s phase 1b vaccination guidelines, prioritizing those 65 and over as the next eligible  group to receive the COVID-19 vaccine, are detailed at [email or web address].[email or web address].   Vaccine Safety and Effectiveness Clinical trials for the Pfizer COVID-19 vaccine involved 42,000 people and showed that the vaccine is more than 95% effective in preventing COVID-19 with no serious safety concerns. Similar results have been reported for the Moderna COVID-19 vaccine. Side effects reported in the [place] clinical trials include a sore arm at the injection site, fatigue, headache, chills and fever. While side effects from the [place] COVID-19 vaccine are higher than for a typical flu vaccine, they are lower in many ways than side effects from the leading vaccine to prevent shingles. Side effects are signs that a vaccine is working and are related to your immune system being stimulated to produce antibodies against infection. Side effects from vaccination are far less significant than health impacts from COVID-19.  Staying Informed Pharmacists, infectious disease doctors, critical care nurses and other experts at [facility] continue to speak publicly through media interviews and direct communication with our patients and communities about the safety, effectiveness and importance of vaccines to eliminate COVID-19. In addition, reliable information on vaccine safety, effectiveness, side effects and more is available on the following websites:  [place] Department of Health and Human Services COVID-19 Vaccine Information Website.  U.S. Centers for Disease Control and Prevention HWTUU-82 Human resources officer.  Staying Safe We agree with the CDC on what we can do to help our communities get back to normal: Getting "back to normal" is going to take all of our tools. If we use all the tools we have, we stand the best chance of getting our families, communities,  schools and workplaces "back to normal" sooner:  Get vaccinated as soon as vaccines become available within the phase of the state's vaccination  rollout plan for which you meet the eligibility criteria.  Wear a mask.  Stay 6 feet from others and avoid crowds.  Wash hands often.  For our most current information, please visit [email or web address].

## 2019-05-24 NOTE — Progress Notes (Signed)
Pre visit review using our clinic review tool, if applicable. No additional management support is needed unless otherwise documented below in the visit note. 

## 2019-05-25 NOTE — Assessment & Plan Note (Signed)
Here for CPX HTN: Continue losartan, metoprolol, monitor BPs at home.  Checking appropriate labs Hyperlipidemia: Diet controlled, CV RF calculated at 4.3%. Obesity: Diet and exercise discussed Vitamin D deficiency: Recommend OTCs RTC 1 year

## 2019-05-25 NOTE — Assessment & Plan Note (Signed)
-  Td 2012. - s/p Shingrix - had a flu shot  - covid vaccine recommended  -Female care:  To see gyn 07/2019 DEXA WNL 05/2018  MMG done 05/2018 -CCS:  Cscope 2014, unable to get records.    cscope 06/2018: polyps-tics.  Next per GI -Labs: CMP, FLP, CBC, TSH -Diet: Trying to do better but unable to lose weight, recommend to consider the wellness center. -Exercise: Doing some walking.  Recommend to focus on diet/weight loss before she increases her exercise.

## 2019-09-27 LAB — HM MAMMOGRAPHY

## 2019-11-17 ENCOUNTER — Encounter: Payer: Self-pay | Admitting: Internal Medicine

## 2020-02-18 ENCOUNTER — Other Ambulatory Visit: Payer: Self-pay | Admitting: Internal Medicine

## 2020-03-13 HISTORY — PX: GLAUCOMA SURGERY: SHX656

## 2020-05-21 ENCOUNTER — Other Ambulatory Visit: Payer: Self-pay | Admitting: Internal Medicine

## 2020-05-24 ENCOUNTER — Ambulatory Visit (INDEPENDENT_AMBULATORY_CARE_PROVIDER_SITE_OTHER): Payer: BC Managed Care – PPO | Admitting: Internal Medicine

## 2020-05-24 ENCOUNTER — Encounter: Payer: Self-pay | Admitting: Internal Medicine

## 2020-05-24 ENCOUNTER — Other Ambulatory Visit: Payer: Self-pay

## 2020-05-24 VITALS — BP 141/83 | HR 73 | Temp 98.0°F | Ht 63.0 in | Wt 204.0 lb

## 2020-05-24 DIAGNOSIS — E559 Vitamin D deficiency, unspecified: Secondary | ICD-10-CM | POA: Diagnosis not present

## 2020-05-24 DIAGNOSIS — Z0000 Encounter for general adult medical examination without abnormal findings: Secondary | ICD-10-CM | POA: Diagnosis not present

## 2020-05-24 DIAGNOSIS — Z23 Encounter for immunization: Secondary | ICD-10-CM | POA: Diagnosis not present

## 2020-05-24 LAB — CBC WITH DIFFERENTIAL/PLATELET
Basophils Absolute: 0.1 10*3/uL (ref 0.0–0.1)
Basophils Relative: 0.8 % (ref 0.0–3.0)
Eosinophils Absolute: 0.1 10*3/uL (ref 0.0–0.7)
Eosinophils Relative: 1.9 % (ref 0.0–5.0)
HCT: 44 % (ref 36.0–46.0)
Hemoglobin: 14.5 g/dL (ref 12.0–15.0)
Lymphocytes Relative: 25.7 % (ref 12.0–46.0)
Lymphs Abs: 1.7 10*3/uL (ref 0.7–4.0)
MCHC: 33 g/dL (ref 30.0–36.0)
MCV: 85.7 fl (ref 78.0–100.0)
Monocytes Absolute: 0.6 10*3/uL (ref 0.1–1.0)
Monocytes Relative: 8.9 % (ref 3.0–12.0)
Neutro Abs: 4.1 10*3/uL (ref 1.4–7.7)
Neutrophils Relative %: 62.7 % (ref 43.0–77.0)
Platelets: 255 10*3/uL (ref 150.0–400.0)
RBC: 5.13 Mil/uL — ABNORMAL HIGH (ref 3.87–5.11)
RDW: 13.8 % (ref 11.5–15.5)
WBC: 6.6 10*3/uL (ref 4.0–10.5)

## 2020-05-24 LAB — LIPID PANEL
Cholesterol: 213 mg/dL — ABNORMAL HIGH (ref 0–200)
HDL: 66.1 mg/dL (ref >39.00)
LDL Cholesterol: 122 mg/dL — ABNORMAL HIGH (ref 0–99)
NonHDL: 147.31
Total CHOL/HDL Ratio: 3
Triglycerides: 128 mg/dL (ref 0.0–149.0)
VLDL: 25.6 mg/dL (ref 0.0–40.0)

## 2020-05-24 LAB — VITAMIN D 25 HYDROXY (VIT D DEFICIENCY, FRACTURES): VITD: 22.39 ng/mL — ABNORMAL LOW (ref 30.00–100.00)

## 2020-05-24 LAB — COMPREHENSIVE METABOLIC PANEL
ALT: 34 U/L (ref 0–35)
AST: 23 U/L (ref 0–37)
Albumin: 4.3 g/dL (ref 3.5–5.2)
Alkaline Phosphatase: 92 U/L (ref 39–117)
BUN: 12 mg/dL (ref 6–23)
CO2: 28 mEq/L (ref 19–32)
Calcium: 9.7 mg/dL (ref 8.4–10.5)
Chloride: 103 mEq/L (ref 96–112)
Creatinine, Ser: 0.98 mg/dL (ref 0.40–1.20)
GFR: 62.94 mL/min (ref >60.00)
Glucose, Bld: 96 mg/dL (ref 70–99)
Potassium: 4.5 mEq/L (ref 3.5–5.1)
Sodium: 140 mEq/L (ref 135–145)
Total Bilirubin: 0.8 mg/dL (ref 0.2–1.2)
Total Protein: 7.4 g/dL (ref 6.0–8.3)

## 2020-05-24 LAB — TSH: TSH: 2.24 u[IU]/mL (ref 0.35–4.50)

## 2020-05-24 NOTE — Patient Instructions (Addendum)
Watch your diet closely, watch your portions, try to eat low-salt Check the  blood pressure every week BP GOAL is between 110/65 and  135/85.   GO TO THE LAB : Get the blood work     [place], [place] Come back for a checkup in 6 months (to check your blood pressure)  Advance Directive  Advance directives are legal documents that allow you to make decisions about your health care and medical treatment in case you become unable to communicate for yourself. Advance directives let your wishes be known to family, friends, and health care providers. Discussing and writing advance directives should happen over time rather than all at once. Advance directives can be changed and updated at any time. There are different types of advance directives, such as:  Medical power of attorney.  Living will.  Do not resuscitate (DNR) order or do not attempt resuscitation (DNAR) order. Health care proxy and medical power of attorney A health care proxy is also called a health care agent. This person is appointed to make medical decisions for you when you are unable to make decisions for yourself. Generally, people ask a trusted friend or family member to act as their proxy and represent their preferences. Make sure you have an agreement with your trusted person to act as your proxy. A proxy may have to make a medical decision on your behalf if your wishes are not known. A medical power of attorney, also called a durable power of attorney for health care, is a legal document that names your health care proxy. Depending on the laws in your state, the document may need to be:  Signed.  Notarized.  Dated.  Copied.  Witnessed.  Incorporated into your medical record. You may also want to appoint a trusted person to manage your money in the event you are unable to do so. This is called a durable power of attorney for finances. It is a separate legal document from the  durable power of attorney for health care. You may choose your health care proxy or someone different to act as your agent in money matters. If you do not appoint a proxy, or there is a concern that the proxy is not acting in your best interest, a court may appoint a guardian to act on your behalf. Living will A living will is a set of instructions that state your wishes about medical care when you cannot express them yourself. Health care providers should keep a copy of your living will in your medical record. You may want to give a copy to family members or friends. To alert caregivers in case of an emergency, you can place a card in your wallet to let them know that you have a living will and where they can find it. A living will is used if you become:  Terminally ill.  Disabled.  Unable to communicate or make decisions. The following decisions should be included in your living will:  To use or not to use life support equipment, such as dialysis machines and breathing machines (ventilators).  Whether you want a DNR or DNAR order. This tells health care providers not to use cardiopulmonary resuscitation (CPR) if breathing or heartbeat stops.  To use or not to use tube feeding.  To be given or not to be given food and fluids.  Whether you want comfort (palliative) care when the goal becomes comfort rather than a cure.  Whether you want  to donate your organs and tissues. A living will does not give instructions for distributing your money and property if you should pass away. DNR or DNAR A DNR or DNAR order is a request not to have CPR in the event that your heart stops beating or you stop breathing. If a DNR or DNAR order has not been made and shared, a health care provider will try to help any patient whose heart has stopped or who has stopped breathing. If you plan to have surgery, talk with your health care provider about how your DNR or DNAR order will be followed if problems  occur. What if I do not have an advance directive? Some states assign family decision makers to act on your behalf if you do not have an advance directive. Each state has its own laws about advance directives. You may want to check with your health care provider, attorney, or state representative about the laws in your state. Summary  Advance directives are legal documents that allow you to make decisions about your health care and medical treatment in case you become unable to communicate for yourself.  The process of discussing and writing advance directives should happen over time. You can change and update advance directives at any time.  Advance directives may include a medical power of attorney, a living will, and a DNR or DNAR order. This information is not intended to replace advice given to you by your health care provider. Make sure you discuss any questions you have with your health care provider. Document Revised: 01/02/2020 Document Reviewed: 01/02/2020 Elsevier Patient Education  2021 [employer].

## 2020-05-24 NOTE — Progress Notes (Signed)
Subjective:    Patient ID: Taylor Holden, female    DOB: 1960-12-18, 60 y.o.   MRN: [id number]  DOS:  05/24/2020 Type of visit - description: CPX Since the last office visit was under a lot of stress, her son was diagnosed with Crohn's disease, fortunately he is now improving.  Wt Readings from Last 3 Encounters:  05/24/20 204 lb (92.5 kg)  05/24/19 206 lb 8 oz (93.7 kg)  07/08/18 200 lb (90.7 kg)     Review of Systems Since she retired, has more time to exercise, aches and pains have decreased.   Other than above, a 14 point review of systems is negative     Past Medical History:  Diagnosis Date  . Adrenal adenoma   . Allergy   . Aortic atherosclerosis ([place])   . Arthritis   . Early menopause   . Fatty liver   . Glaucoma   . History of kidney stones   . Hyperlipidemia   . Hypertension   . Vitamin D deficiency     Past Surgical History:  Procedure Laterality Date  . CESAREAN SECTION  04/1998   x1  . CHOLECYSTECTOMY  08/1998  . EYE SURGERY Right 01-2014   for glaucoma  . GLAUCOMA SURGERY Bilateral 03/2020  . WISDOM TOOTH EXTRACTION      Allergies as of 05/24/2020      Reactions   Cephalexin Anaphylaxis, Hives   Penicillins Hives, Shortness Of Breath, Swelling   Facial swelling  Has patient had a PCN reaction causing immediate rash, facial/tongue/throat swelling, SOB or lightheadedness with hypotension: Yes Has patient had a PCN reaction causing severe rash involving mucus membranes or skin necrosis: Yes Has patient had a PCN reaction that required hospitalization: No Has patient had a PCN reaction occurring within the last 10 years: No If all of the above answers are "NO", then may proceed with Cephalosporin use.   Prednisone Swelling   Facial swelling and joint swelling       Medication List       Accurate as of May 24, 2020 11:59 PM. If you have any questions, ask your nurse or doctor.        acetaminophen 500 MG tablet Commonly known as:  TYLENOL Take 1,000 mg by mouth every 8 (eight) hours as needed for mild pain or headache.   CENTRUM SILVER 50+WOMEN PO Take by mouth.   latanoprost 0.005 % ophthalmic solution Commonly known as: XALATAN Place 1 drop into both eyes at bedtime.   losartan 100 MG tablet Commonly known as: COZAAR Take 1 tablet (100 mg total) by mouth daily.   metoprolol succinate 50 MG 24 hr tablet Commonly known as: TOPROL-XL Take 1 tablet (50 mg total) by mouth daily. Take with or immediately following a meal   VITAMIN D3 PO Take 1 tablet by mouth daily.          Objective:   Physical Exam BP (!) 141/83 (BP Location: Left Arm, Patient Position: Sitting, Cuff Size: Large)   Pulse 73   Temp 98 F (36.7 C) (Oral)   Ht 5' 3"  (1.6 m)   Wt 204 lb (92.5 kg)   SpO2 97%   BMI 36.14 kg/m  General: Well developed, NAD, BMI noted Neck: No  thyromegaly  HEENT:  Normocephalic . Face symmetric, atraumatic Lungs:  CTA B Normal respiratory effort, no intercostal retractions, no accessory muscle use. Heart: RRR,  no murmur.  Abdomen:  Not distended, soft, non-tender. No rebound or rigidity.  Lower extremities: no pretibial edema bilaterally  Skin: Exposed areas without rash. Not pale. Not jaundice Neurologic:  alert & oriented X3.  Speech normal, gait appropriate for age and unassisted Strength symmetric and appropriate for age.  Psych: Cognition and judgment appear intact.  Cooperative with normal attention span and concentration.  Behavior appropriate. No anxious or depressed appearing.     Assessment      Assessment  New pt to me 10-02-15 HTN -- rx meds 2001; in the past hctz caused low K Obesity Hyperlipidemia Mildly elevated LFTs.  Saw GI 03/2017, additional labs done: normal/negative.  [place] c/w  fatty liver; liver Bx 05/2017: Steatohepatitis Glaucoma  Early menopause, onset 2011 Vitamin D deficiency Allergies H/o  kidney stones  PLAN Here for CPX HTN: On losartan, metoprolol,  ambulatory BPs in the 140s, would like to see better readings, declining to the medication for now, we agreed she will watch her salt intake, continue exercising, reassess in 6 months. Vitamin D deficiency: On supplements, labs Steatohepatitis: Checking labs RTC 6 months   This visit occurred during the SARS-CoV-2 public health emergency.  Safety protocols were in place, including screening questions prior to the visit, additional usage of staff PPE, and extensive cleaning of exam room while observing appropriate contact time as indicated for disinfecting solutions.

## 2020-05-26 ENCOUNTER — Encounter: Payer: Self-pay | Admitting: Internal Medicine

## 2020-05-26 NOTE — Assessment & Plan Note (Signed)
-  Td 2012.  Booster today. - s/p Shingrix -covid vax: x3  - had a flu shot  -Female care:  PAP 06/2018 ([facility]), DEXA WNL 05/2018, MMG 09/2019 ([facility]) -CCS:  Cscope 2014, unable to get records.   cscope 06/2018: polyps-tics.  Next per GI -Labs: CMP, FLP, CBC, TSH, vitamin D -Patient education:  advance directives  -Since she retired, she has more time, taking a walk almost daily.  Diet: Room for improvement, with talk about low-salt, portion control, healthy choices.

## 2020-05-26 NOTE — Assessment & Plan Note (Signed)
Here for CPX HTN: On losartan, metoprolol, ambulatory BPs in the 140s, would like to see better readings, declining to the medication for now, we agreed she will watch her salt intake, continue exercising, reassess in 6 months. Vitamin D deficiency: On supplements, labs Steatohepatitis: Checking labs RTC 6 months

## 2020-05-27 MED ORDER — VITAMIN D (ERGOCALCIFEROL) 1.25 MG (50000 UNIT) PO CAPS: 50000.0000 [IU] | ORAL_CAPSULE | ORAL | 0 refills | Status: DC

## 2020-05-27 NOTE — Addendum Note (Signed)
Addended byDamita Dunnings D on: 05/27/2020 12:53 PM   Modules accepted: Orders

## 2020-07-01 ENCOUNTER — Other Ambulatory Visit: Payer: Self-pay | Admitting: Internal Medicine

## 2020-07-12 HISTORY — PX: CATARACT EXTRACTION, BILATERAL: SHX1313

## 2020-09-06 IMAGING — MG DIGITAL SCREENING BILATERAL MAMMOGRAM WITH TOMO AND CAD
6 of 10 series · 6 of 30 positions shown · non-contrast
Comparison: None.

CLINICAL DATA: Screening.

EXAM:
DIGITAL SCREENING BILATERAL MAMMOGRAM WITH TOMO AND CAD

[R MLO synth-2D (1 of 2)]
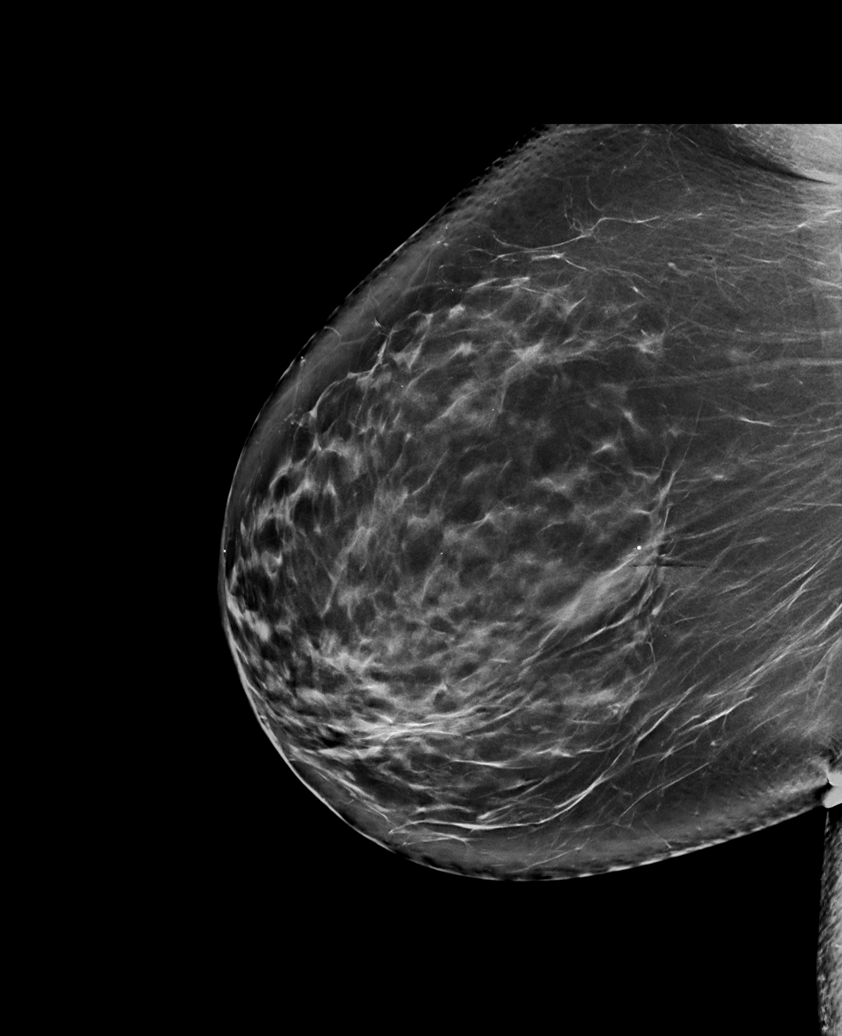

[R CC synth-2D]
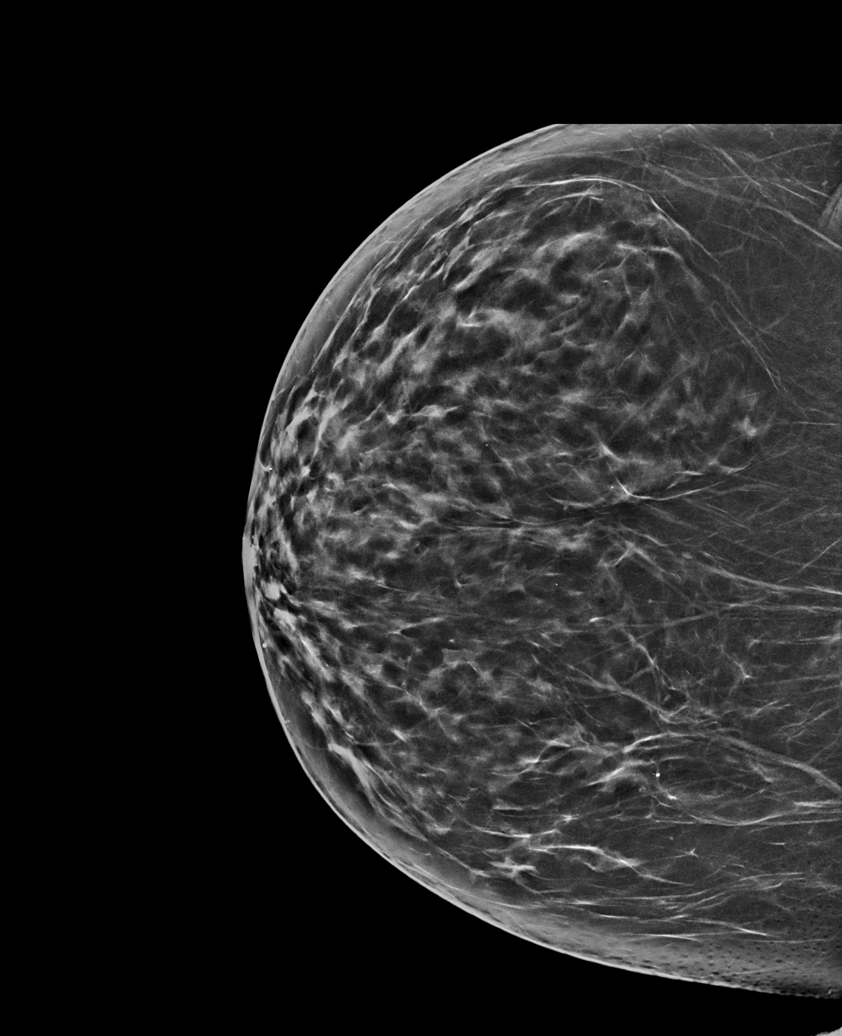

[L MLO synth-2D]
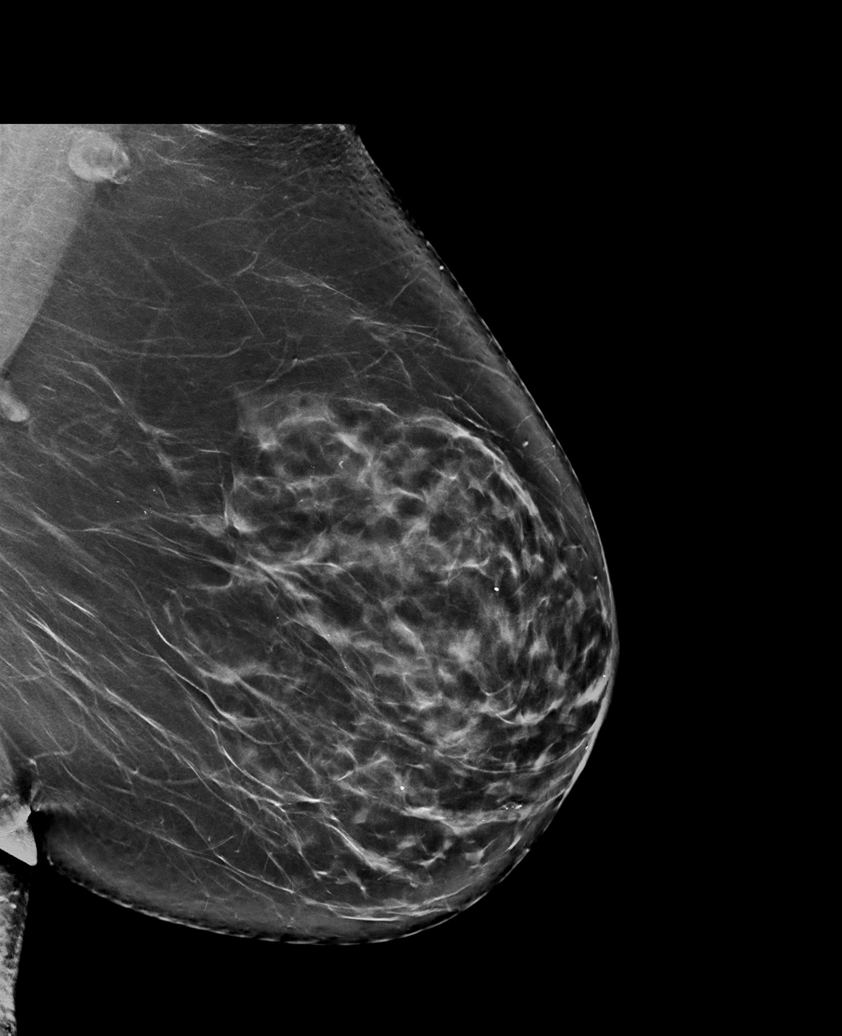

[R MLO synth-2D (2 of 2)]
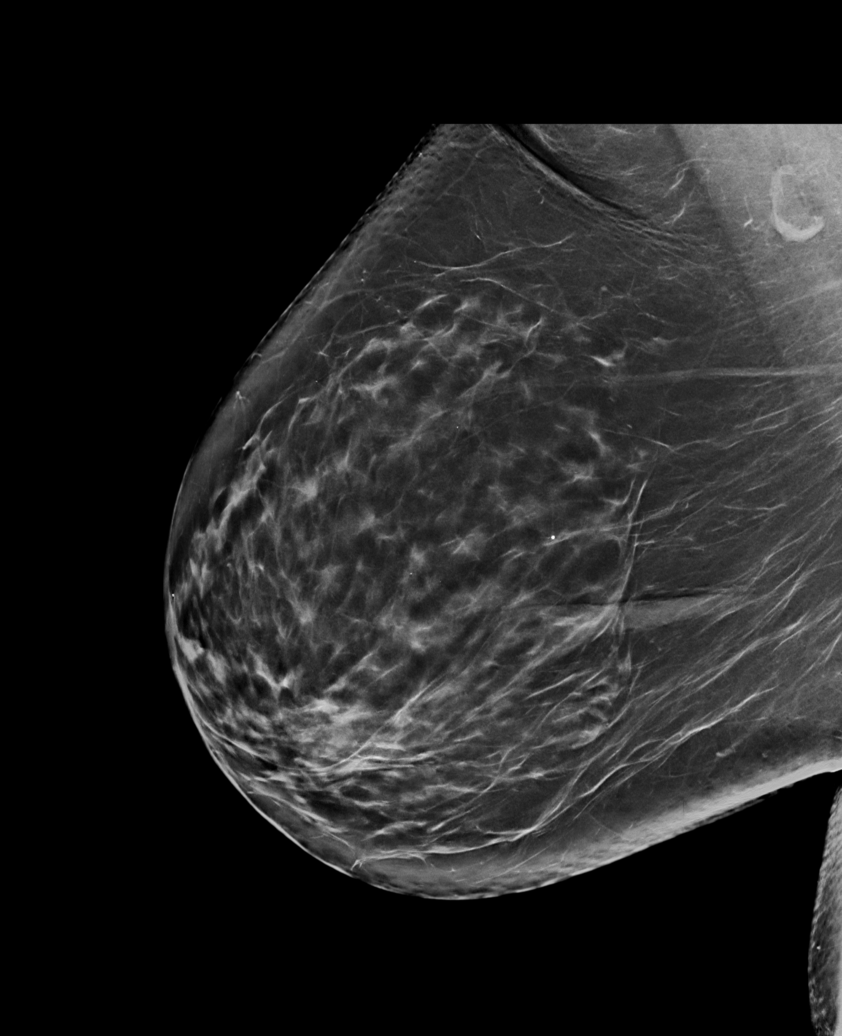

[L CC synth-2D]
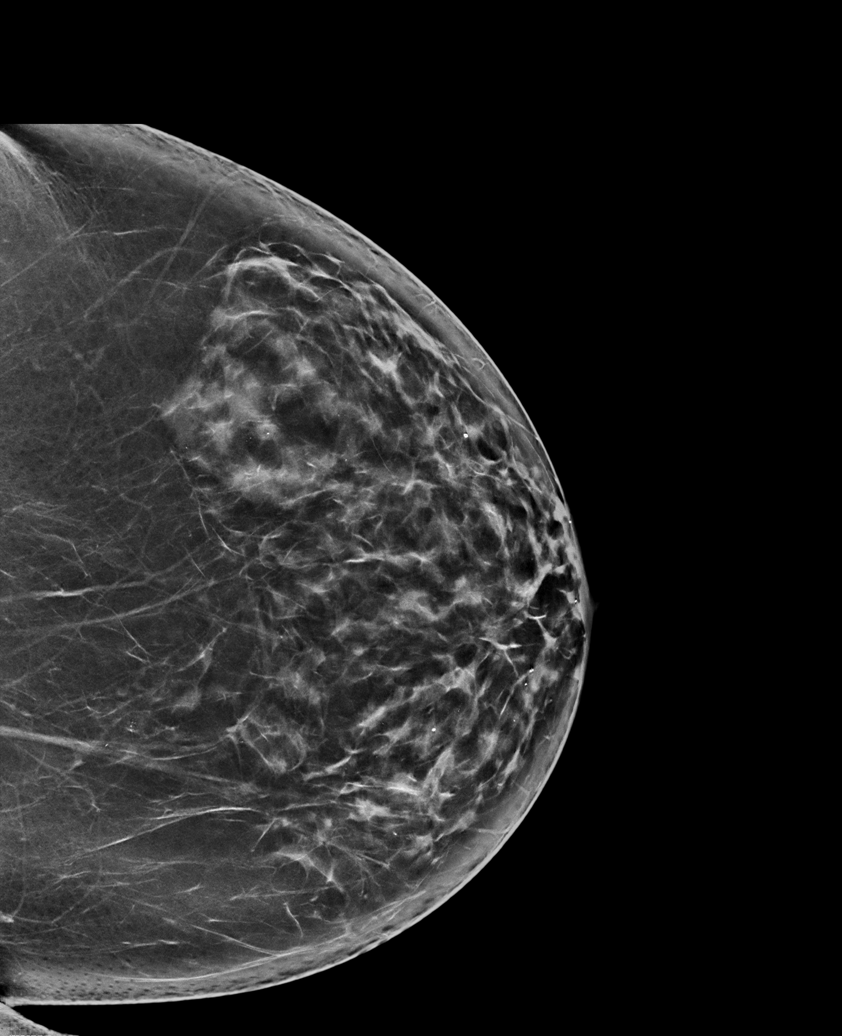

[R CC tomo · tomo slice 37/74.0]
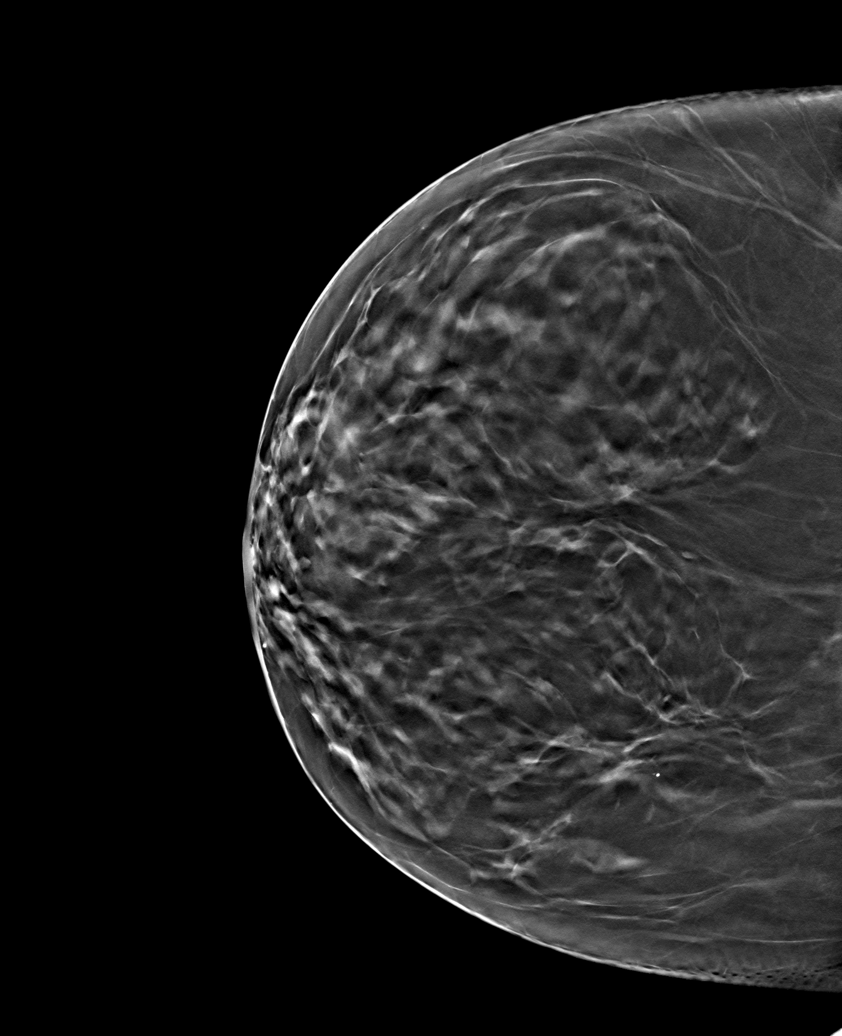

[6 of 30 positions shown; findings below may reference images not displayed]

ACR Breast Density Category c: The breast tissue is heterogeneously
dense, which may obscure small masses
FINDINGS: There are no findings suspicious for malignancy. Images were
processed with CAD.
IMPRESSION: No mammographic evidence of malignancy. A result letter of this
screening mammogram will be mailed directly to the patient.

RECOMMENDATION:
Screening mammogram in one year. (Code:EM-[id number])

BI-RADS CATEGORY  1: Negative.

## 2020-10-07 LAB — HM MAMMOGRAPHY

## 2020-11-19 ENCOUNTER — Encounter: Payer: Self-pay | Admitting: Internal Medicine

## 2020-11-19 ENCOUNTER — Other Ambulatory Visit: Payer: Self-pay | Admitting: Internal Medicine

## 2020-11-20 ENCOUNTER — Encounter: Payer: Self-pay | Admitting: Internal Medicine

## 2020-11-21 ENCOUNTER — Encounter: Payer: Self-pay | Admitting: Internal Medicine

## 2020-11-21 ENCOUNTER — Other Ambulatory Visit: Payer: Self-pay

## 2020-11-21 ENCOUNTER — Ambulatory Visit: Payer: BC Managed Care – PPO | Admitting: Internal Medicine

## 2020-11-21 VITALS — BP 142/84 | HR 83 | Temp 97.8°F | Resp 16 | Ht 63.0 in | Wt 204.5 lb

## 2020-11-21 DIAGNOSIS — I10 Essential (primary) hypertension: Secondary | ICD-10-CM

## 2020-11-21 DIAGNOSIS — E559 Vitamin D deficiency, unspecified: Secondary | ICD-10-CM

## 2020-11-21 DIAGNOSIS — E669 Obesity, unspecified: Secondary | ICD-10-CM

## 2020-11-21 NOTE — Progress Notes (Signed)
Subjective:    Patient ID: Taylor Holden, female    DOB: 12-Jul-1960, 60 y.o.   MRN: [id number]  DOS:  11/21/2020 Type of visit - description: Follow-up  Routine follow-up. Today with talk about vitamin D, blood pressure, increased LFTs. She is also overweight. She is doing her best to take walks regularly. Ambulatory BPs normal. She saw her gynecologist recently.    Wt Readings from Last 3 Encounters:  11/21/20 204 lb 8 oz (92.8 kg)  05/24/20 204 lb (92.5 kg)  05/24/19 206 lb 8 oz (93.7 kg)     Review of Systems See above   Past Medical History:  Diagnosis Date   Adrenal adenoma    Allergy    Aortic atherosclerosis ([place])    Arthritis    Early menopause    Fatty liver    Glaucoma    History of kidney stones    Hyperlipidemia    Hypertension    Vitamin D deficiency     Past Surgical History:  Procedure Laterality Date   CATARACT EXTRACTION, BILATERAL Bilateral 07/2020   CESAREAN SECTION  04/1998   x1   CHOLECYSTECTOMY  08/1998   EYE SURGERY Right 01/2014   for glaucoma   GLAUCOMA SURGERY Bilateral 03/2020   WISDOM TOOTH EXTRACTION      Allergies as of 11/21/2020       Reactions   Cephalexin Anaphylaxis, Hives   Penicillins Hives, Shortness Of Breath, Swelling   Facial swelling  Has patient had a PCN reaction causing immediate rash, facial/tongue/throat swelling, SOB or lightheadedness with hypotension: Yes Has patient had a PCN reaction causing severe rash involving mucus membranes or skin necrosis: Yes Has patient had a PCN reaction that required hospitalization: No Has patient had a PCN reaction occurring within the last 10 years: No If all of the above answers are "NO", then may proceed with Cephalosporin use.   Prednisone Swelling   Facial swelling and joint swelling         Medication List        Accurate as of November 21, 2020 11:59 PM. If you have any questions, ask your nurse or doctor.          STOP taking these medications     Vitamin D (Ergocalciferol) 1.25 MG (50000 UNIT) Caps capsule Commonly known as: DRISDOL Stopped by: Kathlene November, MD   VITAMIN D3 PO Stopped by: Kathlene November, MD       TAKE these medications    acetaminophen 500 MG tablet Commonly known as: TYLENOL Take 1,000 mg by mouth every 8 (eight) hours as needed for mild pain or headache.   CENTRUM SILVER 50+WOMEN PO Take by mouth.   latanoprost 0.005 % ophthalmic solution Commonly known as: XALATAN Place 1 drop into both eyes at bedtime.   losartan 100 MG tablet Commonly known as: COZAAR Take 1 tablet (100 mg total) by mouth daily.   metoprolol succinate 50 MG 24 hr tablet Commonly known as: TOPROL-XL Take 1 tablet (50 mg total) by mouth daily. Take with or immediately following a meal           Objective:   Physical Exam BP (!) 142/84 (BP Location: Left Arm, Patient Position: Sitting, Cuff Size: Normal)   Pulse 83   Temp 97.8 F (36.6 C) (Oral)   Resp 16   Ht 5' 3"  (1.6 m)   Wt 204 lb 8 oz (92.8 kg)   SpO2 94%   BMI 36.23 kg/m  General:  Well developed, NAD, BMI noted. HEENT:  Normocephalic . Face symmetric, atraumatic Lungs:  CTA B Normal respiratory effort, no intercostal retractions, no accessory muscle use. Heart: RRR,  no murmur.  Lower extremities: no pretibial edema bilaterally  Skin: Not pale. Not jaundice Neurologic:  alert & oriented X3.  Speech normal, gait appropriate for age and unassisted Psych--  Cognition and judgment appear intact.  Cooperative with normal attention span and concentration.  Behavior appropriate. No anxious or depressed appearing.      Assessment    Assessment  New pt to me 10-02-15 HTN -- rx meds 2001; in the past hctz caused low K Obesity Hyperlipidemia Mildly elevated LFTs.  Saw GI 03/2017, additional labs done: normal/negative.  [place] c/w  fatty liver; liver Bx 05/2017: Steatohepatitis Glaucoma  Early menopause, onset 2011 Vitamin D deficiency Allergies H/o  kidney  stones  PLAN HTN: Good compliance with Losartan, metoprolol, she show me her ambulatory BP log, all readings are very good in the 120s over 70  (10 points). Last BMP okay, no change. Vitamin D deficiency: s/p ergocalciferol (noted some nosebleeds with ergocalciferol) currently on OTC vitamin D and a multivitamin.  I belive that is sufficient, labs on RTC Obesity: Has not  gained any weight, she is trying to be much more active taking walks most day, occasionally needs to skip due to sciatica pain. Preventive care Plans to get COVID-vaccine and flu shot this fall Saw gynecology, mammogram done, was rec Pap smears every 3 years, next due 2023. RTC 05-2021 CPX   This visit occurred during the SARS-CoV-2 public health emergency.  Safety protocols were in place, including screening questions prior to the visit, additional usage of staff PPE, and extensive cleaning of exam room while observing appropriate contact time as indicated for disinfecting solutions.

## 2020-11-21 NOTE — Patient Instructions (Addendum)
Recommend covid vaccine #4- you may do this today at our pharmacy downstairs if you like.    Check the  blood pressure  BP GOAL is between 110/65 and  135/85. If it is consistently higher or lower, let me know   Taylor Holden, PLEASE SCHEDULE YOUR APPOINTMENTS Come back for   a physical exam by 05-2021

## 2020-11-22 NOTE — Assessment & Plan Note (Signed)
HTN: Good compliance with Losartan, metoprolol, she show me her ambulatory BP log, all readings are very good in the 120s over 70  (10 points). Last BMP okay, no change. Vitamin D deficiency: s/p ergocalciferol (noted some nosebleeds with ergocalciferol) currently on OTC vitamin D and a multivitamin.  I belive that is sufficient, labs on RTC Obesity: Has not  gained any weight, she is trying to be much more active taking walks most day, occasionally needs to skip due to sciatica pain. Preventive care Plans to get COVID-vaccine and flu shot this fall Saw gynecology, mammogram done, was rec Pap smears every 3 years, next due 2023. RTC 05-2021 CPX

## 2020-12-30 ENCOUNTER — Other Ambulatory Visit: Payer: Self-pay | Admitting: Internal Medicine

## 2021-05-13 ENCOUNTER — Other Ambulatory Visit: Payer: Self-pay | Admitting: Internal Medicine

## 2021-05-27 ENCOUNTER — Ambulatory Visit (INDEPENDENT_AMBULATORY_CARE_PROVIDER_SITE_OTHER): Payer: BC Managed Care – PPO | Admitting: Internal Medicine

## 2021-05-27 ENCOUNTER — Encounter: Payer: Self-pay | Admitting: Internal Medicine

## 2021-05-27 ENCOUNTER — Encounter: Payer: Self-pay | Admitting: Gastroenterology

## 2021-05-27 VITALS — BP 122/76 | HR 91 | Temp 98.2°F | Resp 16 | Ht 63.0 in | Wt 206.1 lb

## 2021-05-27 DIAGNOSIS — Z1211 Encounter for screening for malignant neoplasm of colon: Secondary | ICD-10-CM

## 2021-05-27 DIAGNOSIS — I10 Essential (primary) hypertension: Secondary | ICD-10-CM | POA: Diagnosis not present

## 2021-05-27 DIAGNOSIS — Z0000 Encounter for general adult medical examination without abnormal findings: Secondary | ICD-10-CM

## 2021-05-27 DIAGNOSIS — E785 Hyperlipidemia, unspecified: Secondary | ICD-10-CM | POA: Diagnosis not present

## 2021-05-27 LAB — CBC WITH DIFFERENTIAL/PLATELET
Basophils Absolute: 0.1 10*3/uL (ref 0.0–0.1)
Basophils Relative: 0.8 % (ref 0.0–3.0)
Eosinophils Absolute: 0.1 10*3/uL (ref 0.0–0.7)
Eosinophils Relative: 1.3 % (ref 0.0–5.0)
HCT: 41.9 % (ref 36.0–46.0)
Hemoglobin: 13.9 g/dL (ref 12.0–15.0)
Lymphocytes Relative: 26.3 % (ref 12.0–46.0)
Lymphs Abs: 1.6 10*3/uL (ref 0.7–4.0)
MCHC: 33.2 g/dL (ref 30.0–36.0)
MCV: 85.1 fl (ref 78.0–100.0)
Monocytes Absolute: 0.6 10*3/uL (ref 0.1–1.0)
Monocytes Relative: 9.3 % (ref 3.0–12.0)
Neutro Abs: 3.7 10*3/uL (ref 1.4–7.7)
Neutrophils Relative %: 62.3 % (ref 43.0–77.0)
Platelets: 228 10*3/uL (ref 150.0–400.0)
RBC: 4.93 Mil/uL (ref 3.87–5.11)
RDW: 13.5 % (ref 11.5–15.5)
WBC: 6 10*3/uL (ref 4.0–10.5)

## 2021-05-27 NOTE — Progress Notes (Signed)
Subjective:    Patient ID: Taylor Holden, female    DOB: [birth year], 61 y.o.   MRN: [id number]  DOS:  05/27/2021 Type of visit - description: CPX  Since the last office visit is doing well. She is walking more, occasional right hip pain, better with the stretching.  Review of Systems  Other than above, a 14 point review of systems is negative     Past Medical History:  Diagnosis Date   Adrenal adenoma    Allergy    Aortic atherosclerosis ([place])    Arthritis    Early menopause    Fatty liver    Glaucoma    History of kidney stones    Hyperlipidemia    Hypertension    Vitamin D deficiency     Past Surgical History:  Procedure Laterality Date   CATARACT EXTRACTION, BILATERAL Bilateral 07/2020   CESAREAN SECTION  04/1998   x1   CHOLECYSTECTOMY  08/1998   EYE SURGERY Right 01/2014   for glaucoma   GLAUCOMA SURGERY Bilateral 03/2020   WISDOM TOOTH EXTRACTION     Social History   Socioeconomic History   Marital status: Married    Spouse name: Not on file   Number of children: 1   Years of education: Not on file   Highest education level: Not on file  Occupational History   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, museum , retiring 2019  Tobacco Use   Smoking status: Never   Smokeless tobacco: Never  Vaping Use   Vaping Use: Never used  Substance and Sexual Activity   Alcohol use: Never    Alcohol/week: 0.0 standard drinks   Drug use: No   Sexual activity: Not Currently    Partners: Male    Birth control/protection: Post-menopausal  Other Topics Concern   Not on file  Social History Narrative   Household: pt, husband , son   Son 2000, [employer]: graduated;  dx w/ Crohn's Dz 04-2019   Social Determinants of Health   Financial Resource Strain: Not on file  Food Insecurity: Not on file  Transportation Needs: Not on file  Physical Activity: Not on file  Stress: Not on file  Social Connections: Not on file  Intimate Partner Violence: Not on file     Current Outpatient Medications  Medication Instructions   acetaminophen (TYLENOL) 1,000 mg, Oral, Every 8 hours PRN   latanoprost (XALATAN) 0.005 % ophthalmic solution 1 drop, Both Eyes, Daily at bedtime   losartan (COZAAR) 100 mg, Oral, Daily   metoprolol succinate (TOPROL-XL) 50 MG 24 hr tablet TAKE ONE (1) TABLET BY MOUTH EVERY DAY IMMEDIATELY FOLLOWING A MEAL.   Multiple Vitamins-Minerals (CENTRUM SILVER 50+WOMEN PO) Oral       Objective:   Physical Exam BP 122/76 (BP Location: Left Arm, Patient Position: Sitting, Cuff Size: Small)    Pulse 91    Temp 98.2 F (36.8 C) (Oral)    Resp 16    Ht 5\' 3"  (1.6 m)    Wt 206 lb 2 oz (93.5 kg)    SpO2 97%    BMI 36.51 kg/m  General: Well developed, NAD, BMI noted Neck: No  thyromegaly  HEENT:  Normocephalic . Face symmetric, atraumatic Lungs:  CTA B Normal respiratory effort, no intercostal retractions, no accessory muscle use. Heart: RRR,  no murmur.  Abdomen:  Not distended, soft, non-tender. No rebound or rigidity.   Lower extremities: no pretibial edema bilaterally  Skin: Exposed areas without rash. Not pale. Not jaundice Neurologic:  alert & oriented X3.  Speech normal, gait appropriate for age and unassisted Strength symmetric and appropriate for age.  Psych: Cognition and judgment appear intact.  Cooperative with normal attention span and concentration.  Behavior appropriate. No anxious or depressed appearing.     Assessment    Assessment  New pt to me 10-02-15 HTN -- rx meds 2001; in the past hctz caused low K Obesity Hyperlipidemia Mildly elevated LFTs.  Saw GI 03/2017, additional labs done: normal/negative.  [place] c/w  fatty liver; liver Bx 05/2017: Steatohepatitis Glaucoma  Early menopause, onset 2011 Vitamin D deficiency Allergies H/o  kidney stones  PLAN Here for CPX. HTN: On losartan, metoprolol, ambulatory BPs 120, 130.  No change.  Labs Obesity: She is doing great, watching her diet closely, walking 3  times a week.  Praised. Hyperlipidemia: Diet controlled, check labs. Vitamin D deficiency: Previously, ergocalciferol caused nosebleeds, recommend vitamin D3 2000 units a day.  Recheck labs on RTC Next visit 1 year, CPX   This visit occurred during the SARS-CoV-2 public health emergency.  Safety protocols were in place, including screening questions prior to the visit, additional usage of staff PPE, and extensive cleaning of exam room while observing appropriate contact time as indicated for disinfecting solutions.

## 2021-05-27 NOTE — Patient Instructions (Addendum)
Take vitamin D3 OTC:  a total of 2000 units daily.  Check the  blood pressure regularly as you are doing BP GOAL is between 110/65 and  135/85. If it is consistently higher or lower, let me know  Bring or mail your healthcare power of attorney     Taylor Holden LAB : Get the blood work     [place], [place] back for   a physical exam in 1 year

## 2021-05-28 ENCOUNTER — Encounter: Payer: Self-pay | Admitting: Internal Medicine

## 2021-05-28 LAB — COMPREHENSIVE METABOLIC PANEL
ALT: 34 U/L (ref 0–35)
AST: 25 U/L (ref 0–37)
Albumin: 4.3 g/dL (ref 3.5–5.2)
Alkaline Phosphatase: 94 U/L (ref 39–117)
BUN: 13 mg/dL (ref 6–23)
CO2: 29 mEq/L (ref 19–32)
Calcium: 9.7 mg/dL (ref 8.4–10.5)
Chloride: 104 mEq/L (ref 96–112)
Creatinine, Ser: 1.03 mg/dL (ref 0.40–1.20)
GFR: 58.87 mL/min — ABNORMAL LOW (ref >60.00)
Glucose, Bld: 100 mg/dL — ABNORMAL HIGH (ref 70–99)
Potassium: 4.2 mEq/L (ref 3.5–5.1)
Sodium: 140 mEq/L (ref 135–145)
Total Bilirubin: 0.6 mg/dL (ref 0.2–1.2)
Total Protein: 7 g/dL (ref 6.0–8.3)

## 2021-05-28 LAB — LIPID PANEL
Cholesterol: 211 mg/dL — ABNORMAL HIGH (ref 0–200)
HDL: 64.4 mg/dL (ref >39.00)
LDL Cholesterol: 123 mg/dL — ABNORMAL HIGH (ref 0–99)
NonHDL: 146.74
Total CHOL/HDL Ratio: 3
Triglycerides: 119 mg/dL (ref 0.0–149.0)
VLDL: 23.8 mg/dL (ref 0.0–40.0)

## 2021-05-28 NOTE — Assessment & Plan Note (Signed)
Here for CPX. HTN: On losartan, metoprolol, ambulatory BPs 120, 130.  No change.  Labs Obesity: She is doing great, watching her diet closely, walking 3 times a week.  Praised. Hyperlipidemia: Diet controlled, check labs. Vitamin D deficiency: Previously, ergocalciferol caused nosebleeds, recommend vitamin D3 2000 units a day.  Recheck labs on RTC Next visit 1 year, CPX

## 2021-05-28 NOTE — Assessment & Plan Note (Signed)
°-  Td 05-2020 - s/p Shingrix -covid vax: UTD  - had a flu shot  -Female care:  PAP 06/2018 ([facility]), DEXA WNL 05/2018,  MMG 09/2020 ([facility]); saw gyn 09-2020 -CCS:  Cscope 2014, unable to get records.    cscope 06/2018: polyps-tics.  Next 3 years per GI letter, referral sent. -Labs: CMP, FLP, CBC -Lifestyle: Doing great, trying to eat healthy, takes walks 3 times a week. -ACP discussed, recommend to send a copy of her healthcare power of attorney. RTC 1 year

## 2021-06-23 ENCOUNTER — Other Ambulatory Visit: Payer: Self-pay | Admitting: Internal Medicine

## 2021-06-25 ENCOUNTER — Ambulatory Visit (AMBULATORY_SURGERY_CENTER): Payer: BC Managed Care – PPO | Admitting: *Deleted

## 2021-06-25 ENCOUNTER — Other Ambulatory Visit: Payer: Self-pay

## 2021-06-25 ENCOUNTER — Encounter: Payer: Self-pay | Admitting: Gastroenterology

## 2021-06-25 VITALS — Ht 63.5 in | Wt 200.0 lb

## 2021-06-25 DIAGNOSIS — Z86010 Personal history of colonic polyps: Secondary | ICD-10-CM

## 2021-06-25 MED ORDER — PEG 3350-KCL-NA BICARB-NACL 420 G PO SOLR: 4000.0000 mL | Freq: Once | ORAL | 0 refills | Status: AC

## 2021-06-25 NOTE — Progress Notes (Signed)

## 2021-07-09 ENCOUNTER — Ambulatory Visit (AMBULATORY_SURGERY_CENTER): Payer: BC Managed Care – PPO | Admitting: Gastroenterology

## 2021-07-09 ENCOUNTER — Other Ambulatory Visit: Payer: Self-pay

## 2021-07-09 ENCOUNTER — Encounter: Payer: Self-pay | Admitting: Gastroenterology

## 2021-07-09 VITALS — BP 130/70 | HR 79 | Temp 97.0°F | Resp 10 | Ht 63.0 in | Wt 206.0 lb

## 2021-07-09 DIAGNOSIS — Z86010 Personal history of colonic polyps: Secondary | ICD-10-CM | POA: Diagnosis present

## 2021-07-09 DIAGNOSIS — D123 Benign neoplasm of transverse colon: Secondary | ICD-10-CM

## 2021-07-09 MED ORDER — SODIUM CHLORIDE 0.9 % IV SOLN: 500.0000 mL | Freq: Once | INTRAVENOUS | Status: DC

## 2021-07-09 NOTE — Progress Notes (Signed)
PT taken to PACU. Monitors in place. VSS. Report given to RN. 

## 2021-07-09 NOTE — Progress Notes (Signed)
Pt's states no medical or surgical changes since previsit or office visit. 

## 2021-07-09 NOTE — Patient Instructions (Signed)
Handout on polyps, diverticulosis and hemorrhoids given. ? ?YOU HAD AN ENDOSCOPIC PROCEDURE TODAY AT [facility]:   Refer to the procedure report that was given to you for any specific questions about what was found during the examination.  If the procedure report does not answer your questions, please call your gastroenterologist to clarify.  If you requested that your care partner not be given the details of your procedure findings, then the procedure report has been included in a sealed envelope for you to review at your convenience later. ? ?YOU SHOULD EXPECT: Some feelings of bloating in the abdomen. Passage of more gas than usual.  Walking can help get rid of the air that was put into your GI tract during the procedure and reduce the bloating. If you had a lower endoscopy (such as a colonoscopy or flexible sigmoidoscopy) you may notice spotting of blood in your stool or on the toilet paper. If you underwent a bowel prep for your procedure, you may not have a normal bowel movement for a few days. ? ?Please Note:  You might notice some irritation and congestion in your nose or some drainage.  This is from the oxygen used during your procedure.  There is no need for concern and it should clear up in a day or so. ? ?SYMPTOMS TO REPORT IMMEDIATELY: ? ?Following lower endoscopy (colonoscopy or flexible sigmoidoscopy): ? Excessive amounts of blood in the stool ? Significant tenderness or worsening of abdominal pains ? Swelling of the abdomen that is new, acute ? Fever of 100?F or higher ? ? ?For urgent or emergent issues, a gastroenterologist can be reached at any hour by calling [phone]. ?Do not use MyChart messaging for urgent concerns.  ? ? ?DIET:  We do recommend a small meal at first, but then you may proceed to your regular diet.  Drink plenty of fluids but you should avoid alcoholic beverages for 24 hours. ? ?ACTIVITY:  You should plan to take it easy for the rest of today and you  should NOT DRIVE or use heavy machinery until tomorrow (because of the sedation medicines used during the test).   ? ?FOLLOW UP: ?Our staff will call the number listed on your records 48-72 hours following your procedure to check on you and address any questions or concerns that you may have regarding the information given to you following your procedure. If we do not reach you, we will leave a message.  We will attempt to reach you two times.  During this call, we will ask if you have developed any symptoms of COVID 19. If you develop any symptoms (ie: fever, flu-like symptoms, shortness of breath, cough etc.) before then, please call [phone].  If you test positive for Covid 19 in the 2 weeks post procedure, please call and report this information to [place].   ? ?If any biopsies were taken you will be contacted by phone or by letter within the next 1-3 weeks.  Please call us at [phone] if you have not heard about the biopsies in 3 weeks.  ? ? ?SIGNATURES/CONFIDENTIALITY: ?You and/or your care partner have signed paperwork which will be entered into your electronic medical record.  These signatures attest to the fact that that the information above on your After Visit Summary has been reviewed and is understood.  Full responsibility of the confidentiality of this discharge information lies with you and/or your care-partner.  ?

## 2021-07-09 NOTE — Progress Notes (Signed)
? ?History & Physical ? ?Primary Care Physician:  Colon Branch, MD ?Primary Gastroenterologist: Lucio Edward, MD ? ?CHIEF COMPLAINT:  Personal history of colon polyps  ? ?HPI: Taylor Holden is a 61 y.o. female with a personal history of adenomatous colon polyps for surveillance colonoscopy. ? ? ?Past Medical History:  ?Diagnosis Date  ? Adrenal adenoma   ? Allergy   ? Aortic atherosclerosis ([place])   ? Arthritis   ? Early menopause   ? Fatty liver   ? Glaucoma   ? History of kidney stones   ? Hyperlipidemia   ? Hypertension   ? Vitamin D deficiency   ? ? ?Past Surgical History:  ?Procedure Laterality Date  ? CATARACT EXTRACTION, BILATERAL Bilateral 07/2020  ? CESAREAN SECTION  04/1998  ? x1  ? CHOLECYSTECTOMY  08/1998  ? EYE SURGERY Right 01/2014  ? for glaucoma  ? GLAUCOMA SURGERY Bilateral 03/2020  ? WISDOM TOOTH EXTRACTION    ? ? ?Prior to Admission medications   ?Medication Sig Start Date End Date Taking? Authorizing Provider  ?acetaminophen (TYLENOL) 500 MG tablet Take 1,000 mg by mouth every 8 (eight) hours as needed for mild pain or headache.   Yes [provider]  ?cholecalciferol (VITAMIN D3) 25 MCG (1000 UNIT) tablet Take 1,000 Units by mouth daily.   Yes [provider]  ?latanoprost (XALATAN) 0.005 % ophthalmic solution Place 1 drop into both eyes at bedtime.   Yes [provider]  ?losartan (COZAAR) 100 MG tablet TAKE ONE (1) TABLET BY MOUTH EVERY DAY 06/23/21  Yes [name], Alda Berthold, MD  ?metoprolol succinate (TOPROL-XL) 50 MG 24 hr tablet TAKE ONE (1) TABLET BY MOUTH EVERY DAY IMMEDIATELY FOLLOWING A MEAL. 05/13/21  Yes Colon Branch, MD  ?Multiple Vitamins-Minerals (CENTRUM SILVER 50+WOMEN PO) Take by mouth.   Yes [provider]  ? ? ?Current Outpatient Medications  ?Medication Sig Dispense Refill  ? acetaminophen (TYLENOL) 500 MG tablet Take 1,000 mg by mouth every 8 (eight) hours as needed for mild pain or headache.    ? cholecalciferol (VITAMIN D3) 25 MCG (1000 UNIT)  tablet Take 1,000 Units by mouth daily.    ? latanoprost (XALATAN) 0.005 % ophthalmic solution Place 1 drop into both eyes at bedtime.    ? losartan (COZAAR) 100 MG tablet TAKE ONE (1) TABLET BY MOUTH EVERY DAY 90 tablet 1  ? metoprolol succinate (TOPROL-XL) 50 MG 24 hr tablet TAKE ONE (1) TABLET BY MOUTH EVERY DAY IMMEDIATELY FOLLOWING A MEAL. 90 tablet 0  ? Multiple Vitamins-Minerals (CENTRUM SILVER 50+WOMEN PO) Take by mouth.    ? ?Current Facility-Administered Medications  ?Medication Dose Route Frequency Provider Last Rate Last Admin  ? 0.9 %  sodium chloride infusion  500 mL Intravenous Once Ladene Artist, MD      ? ? ?Allergies as of 07/09/2021 - Review Complete 07/09/2021  ?Allergen Reaction Noted  ? Cephalexin Anaphylaxis and Hives 11/15/2006  ? Penicillins Hives, Shortness Of Breath, and Swelling 10/01/2015  ? Flagyl [metronidazole] Other (See Comments) 07/09/2021  ? Prednisone Swelling 05/26/2017  ? ? ?Family History  ?Problem Relation Age of Onset  ? Colon polyps Mother   ? Alzheimer's disease Mother   ? Diabetes Father   ? Hypertension Father   ? Stroke Father   ?     aneurysm   ? Diabetes Paternal Grandmother   ? Heart disease Paternal Grandmother   ? Hypertension Paternal Grandmother   ? Colon cancer Neg Hx   ?  Breast cancer Neg Hx   ? Stomach cancer Neg Hx   ? Esophageal cancer Neg Hx   ? Pancreatic cancer Neg Hx   ? Rectal cancer Neg Hx   ? ? ?Social History  ? ?Socioeconomic History  ? Marital status: Married  ?  Spouse name: Not on file  ? Number of children: 1  ? Years of education: Not on file  ? Highest education level: Not on file  ?Occupational History  ? Occupation: RETIRED Occupational hygienist , retiring 2019  ?Tobacco Use  ? Smoking status: Never  ? Smokeless tobacco: Never  ?Vaping Use  ? Vaping Use: Never used  ?Substance and Sexual Activity  ? Alcohol use: Never  ?  Alcohol/week: 0.0 standard drinks  ? Drug use: No  ? Sexual activity: Not Currently  ?  Partners: Male  ?   Birth control/protection: Post-menopausal  ?Other Topics Concern  ? Not on file  ?Social History Narrative  ? Household: pt, husband , son  ? Son 2000, [employer]: graduated;  dx w/ Crohn's Dz 04-2019  ? ?Social Determinants of Health  ? ?Financial Resource Strain: Not on file  ?Food Insecurity: Not on file  ?Transportation Needs: Not on file  ?Physical Activity: Not on file  ?Stress: Not on file  ?Social Connections: Not on file  ?Intimate Partner Violence: Not on file  ? ? ?Review of Systems: ? ?All systems reviewed an negative except where noted in HPI. ? ?Gen: Denies any fever, chills, sweats, anorexia, fatigue, weakness, malaise, weight loss, and sleep disorder ?CV: Denies chest pain, angina, palpitations, syncope, orthopnea, PND, peripheral edema, and claudication. ?Resp: Denies dyspnea at rest, dyspnea with exercise, cough, sputum, wheezing, coughing up blood, and pleurisy. ?GI: Denies vomiting blood, jaundice, and fecal incontinence.   Denies dysphagia or odynophagia. ?GU : Denies urinary burning, blood in urine, urinary frequency, urinary hesitancy, nocturnal urination, and urinary incontinence. ?MS: Denies joint pain, limitation of movement, and swelling, stiffness, low back pain, extremity pain. Denies muscle weakness, cramps, atrophy.  ?Derm: Denies rash, itching, dry skin, hives, moles, warts, or unhealing ulcers.  ?Psych: Denies depression, anxiety, memory loss, suicidal ideation, hallucinations, paranoia, and confusion. ?Heme: Denies bruising, bleeding, and enlarged lymph nodes. ?Neuro:  Denies any headaches, dizziness, paresthesias. ?Endo:  Denies any problems with DM, thyroid, adrenal function. ? ? ?Physical Exam: ?General:  Alert, well-developed, in NAD ?Head:  Normocephalic and atraumatic. ?Eyes:  Sclera clear, no icterus.   Conjunctiva pink. ?Ears:  Normal auditory acuity. ?Mouth:  No deformity or lesions.  ?Neck:  Supple; no masses . ?Lungs:  Clear throughout to auscultation.   No wheezes, crackles,  or rhonchi. No acute distress. ?Heart:  Regular rate and rhythm; no murmurs. ?Abdomen:  Soft, nondistended, nontender. No masses, hepatomegaly. No obvious masses.  Normal bowel .    ?Rectal:  Deferred   ?Msk:  Symmetrical without gross deformities.Marland Kitchen ?Pulses:  Normal pulses noted. ?Extremities:  Without edema. ?Neurologic:  Alert and  oriented x4;  grossly normal neurologically. ?Skin:  Intact without significant lesions or rashes. ?Cervical Nodes:  No significant cervical adenopathy. ?Psych:  Alert and cooperative. Normal mood and affect. ? ? ?Impression / Plan:  ? ?Personal history of adenomatous colon polyps for surveillance colonoscopy. ? ?Johnelle Tafolla T. Fuller Plan  07/09/2021, 11:07 AM ?See Shea Evans, [facility], to contact our on call provider ? ? ?  ?

## 2021-07-09 NOTE — Op Note (Signed)
[facility] ?Patient Name: Taylor Holden ?Procedure Date: 07/09/2021 10:56 AM ?MRN: [id number] ?Endoscopist: Ladene Artist , MD ?Age: 61 ?Referring MD:  ?Date of Birth: 1960-08-20 ?Gender: Female ?Account #: [id number] ?Procedure:                Colonoscopy ?Indications:              Surveillance: Personal history of adenomatous  ?                          polyps on last colonoscopy 3 years ago ?Medicines:                Monitored Anesthesia Care ?Procedure:                Pre-Anesthesia Assessment: ?                          - Prior to the procedure, a History and Physical  ?                          was performed, and patient medications and  ?                          allergies were reviewed. The patient's tolerance of  ?                          previous anesthesia was also reviewed. The risks  ?                          and benefits of the procedure and the sedation  ?                          options and risks were discussed with the patient.  ?                          All questions were answered, and informed consent  ?                          was obtained. Prior Anticoagulants: The patient has  ?                          taken no previous anticoagulant or antiplatelet  ?                          agents. ASA Grade Assessment: II - A patient with  ?                          mild systemic disease. After reviewing the risks  ?                          and benefits, the patient was deemed in  ?                          satisfactory condition to undergo the procedure. ?  After obtaining informed consent, the colonoscope  ?                          was passed under direct vision. Throughout the  ?                          procedure, the patient's blood pressure, pulse, and  ?                          oxygen saturations were monitored continuously. The  ?                          Olympus CF-HQ190L ([id number]) Colonoscope was  ?                          introduced through the anus  and advanced to the the  ?                          cecum, identified by appendiceal orifice and  ?                          ileocecal valve. The ileocecal valve, appendiceal  ?                          orifice, and rectum were photographed. The quality  ?                          of the bowel preparation was good. The colonoscopy  ?                          was performed without difficulty. The patient  ?                          tolerated the procedure well. ?Scope In: 11:15:08 AM ?Scope Out: 11:27:16 AM ?Scope Withdrawal Time: 0 hours 9 minutes 54 seconds  ?Total Procedure Duration: 0 hours 12 minutes 8 seconds  ?Findings:                 The perianal and digital rectal examinations were  ?                          normal. ?                          A 7 mm polyp was found in the transverse colon. The  ?                          polyp was sessile. The polyp was removed with a  ?                          cold snare. Resection and retrieval were complete. ?                          A single inverted small-mouthed diverticulum was  ?  found in the descending colon. ?                          External and internal hemorrhoids were found during  ?                          retroflexion. The hemorrhoids were small and Grade  ?                          I (internal hemorrhoids that do not prolapse). ?                          The exam was otherwise without abnormality on  ?                          direct and retroflexion views. ?Complications:            No immediate complications. Estimated blood loss:  ?                          None. ?Estimated Blood Loss:     Estimated blood loss: none. ?Impression:               - One 7 mm polyp in the transverse colon, removed  ?                          with a cold snare. Resected and retrieved. ?                          - Mild diverticulosis in the descending colon. ?                          - External and internal hemorrhoids. ?                           - The examination was otherwise normal on direct  ?                          and retroflexion views. ?Recommendation:           - Repeat colonoscopy after studies are complete for  ?                          surveillance based on pathology results. ?                          - Patient has a contact number available for  ?                          emergencies. The signs and symptoms of potential  ?                          delayed complications were discussed with the  ?                          patient. Return to normal activities tomorrow.  ?  Written discharge instructions were provided to the  ?                          patient. ?                          - Resume previous diet. ?                          - Continue present medications. ?                          - Await pathology results. ?Ladene Artist, MD ?07/09/2021 11:31:26 AM ?This report has been signed electronically. ?

## 2021-07-09 NOTE — Progress Notes (Signed)
Called to room to assist during endoscopic procedure.  Patient ID and intended procedure confirmed with present staff. Received instructions for my participation in the procedure from the performing physician.  

## 2021-07-11 ENCOUNTER — Telehealth: Payer: Self-pay

## 2021-07-11 ENCOUNTER — Telehealth: Payer: Self-pay | Admitting: *Deleted

## 2021-07-11 NOTE — Telephone Encounter (Signed)
Patient returned your call, stated that she is doing well.  ?

## 2021-07-11 NOTE — Telephone Encounter (Signed)
Pt calls back after follow up call and states she is doing fine. ?

## 2021-07-11 NOTE — Telephone Encounter (Signed)
No answer for post procedure follow up call left VM. ?

## 2021-07-14 ENCOUNTER — Encounter: Payer: Self-pay | Admitting: Gastroenterology

## 2021-08-11 ENCOUNTER — Other Ambulatory Visit: Payer: Self-pay | Admitting: Internal Medicine

## 2021-11-17 LAB — HM MAMMOGRAPHY

## 2021-11-17 LAB — HM PAP SMEAR: HM Pap smear: NEGATIVE

## 2021-12-03 ENCOUNTER — Encounter: Payer: Self-pay | Admitting: Internal Medicine

## 2021-12-04 NOTE — Progress Notes (Addendum)
[facility] Note  12/09/2021     CHIEF COMPLAINT Patient presents for Retina Evaluation   HISTORY OF PRESENT ILLNESS: Taylor Holden is a 61 y.o. female who presents to the clinic today for:   HPI     Retina Evaluation   In right eye.  Associated Symptoms Floaters.  I, the attending physician,  performed the HPI with the patient and updated documentation appropriately.        Comments   Patient is here based on a referral from Dr. Ellie Lunch for a retinal eval. Patient states that she has had floaters for years. She has a history of Glaucoma with history of SLT OU.      Last edited by Bernarda Caffey, MD on 12/09/2021 11:13 PM.    Pt is here on the referral of Dr. Ellie Lunch for concern of floaters OD, pt saw her last week and has started seeing the floaters since her visit there, pt states she has glaucoma and has had SLT sx OU, cataract sx OU and Super K OD between January-April 2022  Referring physician: Luberta Mutter, MD [place],  [place] [address]  HISTORICAL INFORMATION:   Selected notes from the MEDICAL RECORD NUMBER Referred by Dr. Ellie Lunch for floaters LEE:  Ocular Hx- PMH-    CURRENT MEDICATIONS: Current Outpatient Medications (Ophthalmic Drugs)  Medication Sig   latanoprost (XALATAN) 0.005 % ophthalmic solution Place 1 drop into both eyes at bedtime.   No current facility-administered medications for this visit. (Ophthalmic Drugs)   Current Outpatient Medications (Other)  Medication Sig   acetaminophen (TYLENOL) 500 MG tablet Take 1,000 mg by mouth every 8 (eight) hours as needed for mild pain or headache.   cholecalciferol (VITAMIN D3) 25 MCG (1000 UNIT) tablet Take 1,000 Units by mouth daily.   losartan (COZAAR) 100 MG tablet TAKE ONE (1) TABLET BY MOUTH EVERY DAY   metoprolol succinate (TOPROL-XL) 50 MG 24 hr tablet TAKE ONE (1) TABLET BY MOUTH EVERY DAY IMMEDIATELY FOLLOWING A MEAL.   Multiple Vitamins-Minerals  (CENTRUM SILVER 50+WOMEN PO) Take by mouth.   No current facility-administered medications for this visit. (Other)   REVIEW OF SYSTEMS: ROS   Positive for: Cardiovascular, Eyes Negative for: Constitutional, Gastrointestinal, Neurological, Skin, Genitourinary, Musculoskeletal, HENT, Endocrine, Respiratory, Psychiatric, Allergic/Imm, Heme/Lymph Last edited by Bernarda Caffey, MD on 12/09/2021 11:17 PM.     ALLERGIES Allergies  Allergen Reactions   Cephalexin Anaphylaxis and Hives   Penicillins Hives, Shortness Of Breath and Swelling    Facial swelling  Has patient had a PCN reaction causing immediate rash, facial/tongue/throat swelling, SOB or lightheadedness with hypotension: Yes Has patient had a PCN reaction causing severe rash involving mucus membranes or skin necrosis: Yes Has patient had a PCN reaction that required hospitalization: No Has patient had a PCN reaction occurring within the last 10 years: No If all of the above answers are "NO", then may proceed with Cephalosporin use.    Flagyl [Metronidazole] Other (See Comments)   Prednisone Swelling    Facial swelling and joint swelling    PAST MEDICAL HISTORY Past Medical History:  Diagnosis Date   Adrenal adenoma    Allergy    Aortic atherosclerosis ([place])    Arthritis    Early menopause    Fatty liver    Glaucoma    History of kidney stones    Hyperlipidemia    Hypertension    Vitamin D deficiency    Past Surgical History:  Procedure Laterality Date   CATARACT EXTRACTION, BILATERAL Bilateral 07/2020   CESAREAN SECTION  04/1998   x1   CHOLECYSTECTOMY  08/1998   EYE SURGERY Right 01/2014   for glaucoma   GLAUCOMA SURGERY Bilateral 03/2020   WISDOM TOOTH EXTRACTION     FAMILY HISTORY Family History  Problem Relation Age of Onset   Colon polyps Mother    Alzheimer's disease Mother    Diabetes Father    Hypertension Father    Stroke Father        aneurysm    Diabetes Paternal Grandmother    Heart disease  Paternal Grandmother    Hypertension Paternal Grandmother    Colon cancer Neg Hx    Breast cancer Neg Hx    Stomach cancer Neg Hx    Esophageal cancer Neg Hx    Pancreatic cancer Neg Hx    Rectal cancer Neg Hx    SOCIAL HISTORY Social History   Tobacco Use   Smoking status: Never   Smokeless tobacco: Never  Vaping Use   Vaping Use: Never used  Substance Use Topics   Alcohol use: Never    Alcohol/week: 0.0 standard drinks of alcohol   Drug use: No       OPHTHALMIC EXAM:  Base Eye Exam     Visual Acuity (Snellen - Linear)       Right Left   Dist [place] 20/25 +2 20/20 +2         Tonometry (Tonopen, 9:00 AM)       Right Left   Pressure 17 15         Pupils       Dark Light Shape React APD   Right 3 3 Round Minimal None   Left 3 3 Round Minimal None         Visual Fields       Left Right    Full Full         Extraocular Movement       Right Left    Full, Ortho Full, Ortho         Neuro/Psych     Oriented x3: Yes         Dilation     Both eyes: 1.0% Mydriacyl, 2.5% Phenylephrine @ 8:58 AM           Slit Lamp and Fundus Exam     External Exam       Right Left   External Normal Normal         Slit Lamp Exam       Right Left   Lids/Lashes Dermatochalasis - upper lid, Telangiectasia, mild MGD Dermatochalasis - upper lid, Telangiectasia, mild MGD   Conjunctiva/Sclera White and quiet White and quiet   Cornea well healed cataract wound, fine endo pigment, mild corneal haze nasally well healed cataract wound, fine endo pigment   Anterior Chamber deep and clear deep and clear   Iris Round and dilated Round and dilated   Lens PC IOL in good position, trace Posterior capsular opacification PC IOL in good position   Anterior Vitreous Vitreous syneresis, trace fine pigment, Posterior vitreous detachment, mild blood stained vitreous condensations Vitreous syneresis         Fundus Exam       Right Left   Disc Pink and Sharp,  Compact Pink and Sharp, Compact   C/D Ratio 0.3 0.1   Macula Flat, Blunted foveal reflex, RPE mottling, No heme or edema Flat, good foveal reflex,  mild RPE mottling, No heme or edema   Vessels attenuated, Tortuous mild attenuation, mild tortuosity   Periphery Attached, No RT/RD on 360 scleral depression Attached           IMAGING AND PROCEDURES  Imaging and Procedures for 12/09/2021  OCT, Retina - OU - Both Eyes       Right Eye Quality was good. Central Foveal Thickness: 333. Progression has no prior data. Findings include normal foveal contour, no IRF, no SRF (Vitreous opacities, PVD).   Left Eye Quality was good. Central Foveal Thickness: 322. Progression has no prior data. Findings include normal foveal contour, no IRF, no SRF, vitreomacular adhesion .   Notes *Images captured and stored on drive  Diagnosis / Impression:  NFP, no IRF/SRF OU OD: PVD and vitreous opacities  Clinical management:  See below  Abbreviations: NFP - Normal foveal profile. CME - cystoid macular edema. PED - pigment epithelial detachment. IRF - intraretinal fluid. SRF - subretinal fluid. EZ - ellipsoid zone. ERM - epiretinal membrane. ORA - outer retinal atrophy. ORT - outer retinal tubulation. SRHM - subretinal hyper-reflective material. IRHM - intraretinal hyper-reflective material            ASSESSMENT/PLAN:    ICD-10-CM   1. Posterior vitreous detachment of right eye  H43.811 OCT, Retina - OU - Both Eyes    2. Vitreous hemorrhage of right eye ([place])  H43.11     3. Essential hypertension  I10     4. Hypertensive retinopathy of both eyes  H35.033     5. Pseudophakia, both eyes  Z96.1      1,2. Hemorrhagic PVD OD  - symptomatic floaters; Onset ~1 wk -- originally presented to Dr. Ellie Lunch  - exam shows PVD w/ mild blood stained vit condensations  - No RT or RD on 360 scleral depressed exam  - Discussed findings and prognosis  - Reviewed s/s of RT/RD  - Strict return precautions for  any such RT/RD signs/symptoms  - VH precautions reviewed -- minimize activities, keep head elevated, avoid ASA/NSAIDs/blood thinners as able  - F/U 4 weeks, sooner prn -- DFE/OCT  3,4. Hypertensive retinopathy OU - discussed importance of tight BP control - monitor  5. Pseudophakia OU  - s/p CE/IOL  - IOL in good position, doing well  - monitor  Ophthalmic Meds Ordered this visit:  No orders of the defined types were placed in this encounter.    Return in about 4 weeks (around 01/06/2022) for f/u PVD OD, DFE, OCT.  There are no Patient Instructions on file for this visit.   Explained the diagnoses, plan, and follow up with the patient and they expressed understanding.  Patient expressed understanding of the importance of proper follow up care.   This document serves as a record of services personally performed by Gardiner Sleeper, MD, PhD. It was created on their behalf by Renaldo Reel, [place] an ophthalmic technician. The creation of this record is the provider's dictation and/or activities during the visit.    Electronically signed by:  Renaldo Reel, COT  12/04/21 11:18 PM  This document serves as a record of services personally performed by Gardiner Sleeper, MD, PhD. It was created on their behalf by San Jetty. Owens Shark, OA an ophthalmic technician. The creation of this record is the provider's dictation and/or activities during the visit.    Electronically signed by: San Jetty. Owens Shark, [place] 08.29.2023 11:18 PM  Gardiner Sleeper, M.D., Ph.D. Diseases & Surgery of the Retina  and Vitreous [facility]  I have reviewed the above documentation for accuracy and completeness, and I agree with the above. Gardiner Sleeper, M.D., Ph.D. 12/09/21 11:21 PM   Abbreviations: M myopia (nearsighted); A astigmatism; H hyperopia (farsighted); P presbyopia; Mrx spectacle prescription;  CTL contact lenses; OD right eye; OS left eye; OU both eyes  XT exotropia; ET esotropia;  PEK punctate epithelial keratitis; PEE punctate epithelial erosions; DES dry eye syndrome; MGD meibomian gland dysfunction; ATs artificial tears; PFAT's preservative free artificial tears; [place] nuclear sclerotic cataract; PSC posterior subcapsular cataract; ERM epi-retinal membrane; PVD posterior vitreous detachment; RD retinal detachment; DM diabetes mellitus; DR diabetic retinopathy; NPDR non-proliferative diabetic retinopathy; PDR proliferative diabetic retinopathy; CSME clinically significant macular edema; DME diabetic macular edema; dbh dot blot hemorrhages; CWS cotton wool spot; POAG primary open angle glaucoma; C/D cup-to-disc ratio; HVF humphrey visual field; GVF goldmann visual field; OCT optical coherence tomography; IOP intraocular pressure; BRVO Branch retinal vein occlusion; CRVO central retinal vein occlusion; CRAO central retinal artery occlusion; BRAO branch retinal artery occlusion; RT retinal tear; SB scleral buckle; PPV pars plana vitrectomy; VH Vitreous hemorrhage; PRP panretinal laser photocoagulation; IVK intravitreal kenalog; VMT vitreomacular traction; MH Macular hole;  NVD neovascularization of the disc; NVE neovascularization elsewhere; AREDS age related eye disease study; ARMD age related macular degeneration; POAG primary open angle glaucoma; EBMD epithelial/anterior basement membrane dystrophy; ACIOL anterior chamber intraocular lens; IOL intraocular lens; PCIOL posterior chamber intraocular lens; Phaco/IOL phacoemulsification with intraocular lens placement; [place] photorefractive keratectomy; LASIK laser assisted in situ keratomileusis; HTN hypertension; DM diabetes mellitus; COPD chronic obstructive pulmonary disease

## 2021-12-05 ENCOUNTER — Encounter: Payer: Self-pay | Admitting: Internal Medicine

## 2021-12-09 ENCOUNTER — Ambulatory Visit (INDEPENDENT_AMBULATORY_CARE_PROVIDER_SITE_OTHER): Payer: BC Managed Care – PPO | Admitting: Ophthalmology

## 2021-12-09 ENCOUNTER — Encounter (INDEPENDENT_AMBULATORY_CARE_PROVIDER_SITE_OTHER): Payer: Self-pay | Admitting: Ophthalmology

## 2021-12-09 DIAGNOSIS — H4311 Vitreous hemorrhage, right eye: Secondary | ICD-10-CM | POA: Diagnosis not present

## 2021-12-09 DIAGNOSIS — I10 Essential (primary) hypertension: Secondary | ICD-10-CM

## 2021-12-09 DIAGNOSIS — H43811 Vitreous degeneration, right eye: Secondary | ICD-10-CM | POA: Diagnosis not present

## 2021-12-09 DIAGNOSIS — Z961 Presence of intraocular lens: Secondary | ICD-10-CM

## 2021-12-09 DIAGNOSIS — H35033 Hypertensive retinopathy, bilateral: Secondary | ICD-10-CM | POA: Diagnosis not present

## 2021-12-23 ENCOUNTER — Other Ambulatory Visit: Payer: Self-pay | Admitting: Internal Medicine

## 2022-01-06 NOTE — Progress Notes (Signed)
[facility] Note  01/12/2022     CHIEF COMPLAINT Patient presents for Retina Follow Up   HISTORY OF PRESENT ILLNESS: Taylor Holden is a 61 y.o. female who presents to the clinic today for:   HPI     Retina Follow Up   Patient presents with  PVD.  In right eye.  This started months ago.  Duration of 4 weeks.  Since onset it is stable.  I, the attending physician,  performed the HPI with the patient and updated documentation appropriately.        Comments   Patient feels that the vision is stable but sees less floaters.       Last edited by Bernarda Caffey, MD on 01/12/2022 12:27 PM.    Pt states floaters and fol are improving, pt saw Dr. Ellie Lunch in September and was told everything looks better  Referring physician: Luberta Mutter, MD [place],  [place] [address]  HISTORICAL INFORMATION:   Selected notes from the MEDICAL RECORD NUMBER Referred by Dr. Ellie Lunch for floaters LEE:  Ocular Hx- PMH-    CURRENT MEDICATIONS: Current Outpatient Medications (Ophthalmic Drugs)  Medication Sig   latanoprost (XALATAN) 0.005 % ophthalmic solution Place 1 drop into both eyes at bedtime.   No current facility-administered medications for this visit. (Ophthalmic Drugs)   Current Outpatient Medications (Other)  Medication Sig   acetaminophen (TYLENOL) 500 MG tablet Take 1,000 mg by mouth every 8 (eight) hours as needed for mild pain or headache.   cholecalciferol (VITAMIN D3) 25 MCG (1000 UNIT) tablet Take 1,000 Units by mouth daily.   losartan (COZAAR) 100 MG tablet Take 1 tablet (100 mg total) by mouth daily.   metoprolol succinate (TOPROL-XL) 50 MG 24 hr tablet TAKE ONE (1) TABLET BY MOUTH EVERY DAY IMMEDIATELY FOLLOWING A MEAL.   Multiple Vitamins-Minerals (CENTRUM SILVER 50+WOMEN PO) Take by mouth.   No current facility-administered medications for this visit. (Other)   REVIEW OF SYSTEMS: ROS   Positive for: Cardiovascular,  Eyes Negative for: Constitutional, Gastrointestinal, Neurological, Skin, Genitourinary, Musculoskeletal, HENT, Endocrine, Respiratory, Psychiatric, Allergic/Imm, Heme/Lymph Last edited by Annie Paras, COT on 01/12/2022  9:45 AM.     ALLERGIES Allergies  Allergen Reactions   Cephalexin Anaphylaxis and Hives   Penicillins Hives, Shortness Of Breath and Swelling    Facial swelling  Has patient had a PCN reaction causing immediate rash, facial/tongue/throat swelling, SOB or lightheadedness with hypotension: Yes Has patient had a PCN reaction causing severe rash involving mucus membranes or skin necrosis: Yes Has patient had a PCN reaction that required hospitalization: No Has patient had a PCN reaction occurring within the last 10 years: No If all of the above answers are "NO", then may proceed with Cephalosporin use.    Flagyl [Metronidazole] Other (See Comments)   Prednisone Swelling    Facial swelling and joint swelling    PAST MEDICAL HISTORY Past Medical History:  Diagnosis Date   Adrenal adenoma    Allergy    Aortic atherosclerosis ([place])    Arthritis    Early menopause    Fatty liver    Glaucoma    History of kidney stones    Hyperlipidemia    Hypertension    Vitamin D deficiency    Past Surgical History:  Procedure Laterality Date   CATARACT EXTRACTION, BILATERAL Bilateral 07/2020   CESAREAN SECTION  04/1998   x1   CHOLECYSTECTOMY  08/1998   EYE SURGERY Right  01/2014   for glaucoma   GLAUCOMA SURGERY Bilateral 03/2020   WISDOM TOOTH EXTRACTION     FAMILY HISTORY Family History  Problem Relation Age of Onset   Colon polyps Mother    Alzheimer's disease Mother    Diabetes Father    Hypertension Father    Stroke Father        aneurysm    Diabetes Paternal Grandmother    Heart disease Paternal Grandmother    Hypertension Paternal Grandmother    Colon cancer Neg Hx    Breast cancer Neg Hx    Stomach cancer Neg Hx    Esophageal cancer Neg Hx     Pancreatic cancer Neg Hx    Rectal cancer Neg Hx    SOCIAL HISTORY Social History   Tobacco Use   Smoking status: Never   Smokeless tobacco: Never  Vaping Use   Vaping Use: Never used  Substance Use Topics   Alcohol use: Never    Alcohol/week: 0.0 standard drinks of alcohol   Drug use: No       OPHTHALMIC EXAM:  Base Eye Exam     Visual Acuity (Snellen - Linear)       Right Left   Dist [place] 20/25 -2 20/20         Tonometry (Tonopen, 9:49 AM)       Right Left   Pressure 20 24         Pupils       Dark Light Shape React APD   Right 3 2 Round Brisk None   Left 4 3 Round Brisk None         Visual Fields       Left Right    Full Full         Extraocular Movement       Right Left    Full, Ortho Full, Ortho         Neuro/Psych     Oriented x3: Yes         Dilation     Both eyes: 1.0% Mydriacyl, 2.5% Phenylephrine @ 9:45 AM           Slit Lamp and Fundus Exam     External Exam       Right Left   External Normal Normal         Slit Lamp Exam       Right Left   Lids/Lashes Dermatochalasis - upper lid Dermatochalasis - upper lid   Conjunctiva/Sclera White and quiet White and quiet   Cornea well healed cataract wound, fine endo pigment, mild corneal haze nasally, trace PEE, trace tear film debris well healed cataract wound, fine endo pigment   Anterior Chamber deep and clear deep and clear   Iris Round and dilated Round and dilated   Lens PC IOL in good position, trace Posterior capsular opacification PC IOL in good position   Anterior Vitreous Vitreous syneresis, trace fine pigment, Posterior vitreous detachment, blood stained vitreous condensations vastly improved, trace residual blood staining settled inferiorly Vitreous syneresis, no pigment         Fundus Exam       Right Left   Disc Pink and Sharp, Compact Pink and Sharp, Compact   C/D Ratio 0.2 0.1   Macula Flat, Blunted foveal reflex, RPE mottling, No heme or edema  Flat, good foveal reflex, mild RPE mottling, No heme or edema   Vessels attenuated, Tortuous mild attenuation   Periphery Attached, No RT/RD Attached,  No heme           IMAGING AND PROCEDURES  Imaging and Procedures for 01/12/2022  OCT, Retina - OU - Both Eyes       Right Eye Quality was good. Central Foveal Thickness: 334. Progression has been stable. Findings include normal foveal contour, no IRF, no SRF (Interval improvement in vitreous opacities, PVD).   Left Eye Quality was good. Central Foveal Thickness: 321. Progression has been stable. Findings include normal foveal contour, no IRF, no SRF, vitreomacular adhesion .   Notes *Images captured and stored on drive  Diagnosis / Impression:  NFP, no IRF/SRF OU OD: PVD; interval improvement in vitreous opacities  Clinical management:  See below  Abbreviations: NFP - Normal foveal profile. CME - cystoid macular edema. PED - pigment epithelial detachment. IRF - intraretinal fluid. SRF - subretinal fluid. EZ - ellipsoid zone. ERM - epiretinal membrane. ORA - outer retinal atrophy. ORT - outer retinal tubulation. SRHM - subretinal hyper-reflective material. IRHM - intraretinal hyper-reflective material             ASSESSMENT/PLAN:    ICD-10-CM   1. Posterior vitreous detachment of right eye  H43.811     2. Vitreous hemorrhage of right eye ([place])  H43.11     3. Essential hypertension  I10     4. Hypertensive retinopathy of both eyes  H35.033 OCT, Retina - OU - Both Eyes    5. Pseudophakia, both eyes  Z96.1      1,2. Hemorrhagic PVD OD -- improving  - symptomatic floaters improved; Onset ~1 wk -- originally presented to Dr. Ellie Lunch  - exam shows PVD w/ mild blood stained vit condensations -- improved  - No RT or RD on 360 scleral depressed exam  - Discussed findings and prognosis  - Reviewed s/s of RT/RD  - Strict return precautions for any such RT/RD signs/symptoms  - pt is cleared from a retina standpoint for  release to Dr. Ellie Lunch and resumption of primary eye care  3,4. Hypertensive retinopathy OU - discussed importance of tight BP control - monitor  5. Pseudophakia OU  - s/p CE/IOL  - IOL in good position, doing well  - monitor  Ophthalmic Meds Ordered this visit:  No orders of the defined types were placed in this encounter.    Return if symptoms worsen or fail to improve.  There are no Patient Instructions on file for this visit.   Explained the diagnoses, plan, and follow up with the patient and they expressed understanding.  Patient expressed understanding of the importance of proper follow up care.   This document serves as a record of services personally performed by Gardiner Sleeper, MD, PhD. It was created on their behalf by Roselee Nova, [place]. The creation of this record is the provider's dictation and/or activities during the visit.  Electronically signed by: Roselee Nova, [place] 01/12/22 12:28 PM  This document serves as a record of services personally performed by Gardiner Sleeper, MD, PhD. It was created on their behalf by San Jetty. Owens Shark, OA an ophthalmic technician. The creation of this record is the provider's dictation and/or activities during the visit.    Electronically signed by: San Jetty. Owens Shark, [place] 10.02.2023 12:28 PM   Gardiner Sleeper, M.D., Ph.D. Diseases & Surgery of the Retina and Vitreous [facility]  I have reviewed the above documentation for accuracy and completeness, and I agree with the above. Gardiner Sleeper, M.D., Ph.D. 01/12/22 12:29  PM  Abbreviations: M myopia (nearsighted); A astigmatism; H hyperopia (farsighted); P presbyopia; Mrx spectacle prescription;  CTL contact lenses; OD right eye; OS left eye; OU both eyes  XT exotropia; ET esotropia; PEK punctate epithelial keratitis; PEE punctate epithelial erosions; DES dry eye syndrome; MGD meibomian gland dysfunction; ATs artificial tears; PFAT's preservative free artificial  tears; [place] nuclear sclerotic cataract; PSC posterior subcapsular cataract; ERM epi-retinal membrane; PVD posterior vitreous detachment; RD retinal detachment; DM diabetes mellitus; DR diabetic retinopathy; NPDR non-proliferative diabetic retinopathy; PDR proliferative diabetic retinopathy; CSME clinically significant macular edema; DME diabetic macular edema; dbh dot blot hemorrhages; CWS cotton wool spot; POAG primary open angle glaucoma; C/D cup-to-disc ratio; HVF humphrey visual field; GVF goldmann visual field; OCT optical coherence tomography; IOP intraocular pressure; BRVO Branch retinal vein occlusion; CRVO central retinal vein occlusion; CRAO central retinal artery occlusion; BRAO branch retinal artery occlusion; RT retinal tear; SB scleral buckle; PPV pars plana vitrectomy; VH Vitreous hemorrhage; PRP panretinal laser photocoagulation; IVK intravitreal kenalog; VMT vitreomacular traction; MH Macular hole;  NVD neovascularization of the disc; NVE neovascularization elsewhere; AREDS age related eye disease study; ARMD age related macular degeneration; POAG primary open angle glaucoma; EBMD epithelial/anterior basement membrane dystrophy; ACIOL anterior chamber intraocular lens; IOL intraocular lens; PCIOL posterior chamber intraocular lens; Phaco/IOL phacoemulsification with intraocular lens placement; [place] photorefractive keratectomy; LASIK laser assisted in situ keratomileusis; HTN hypertension; DM diabetes mellitus; COPD chronic obstructive pulmonary disease

## 2022-01-07 ENCOUNTER — Encounter (INDEPENDENT_AMBULATORY_CARE_PROVIDER_SITE_OTHER): Payer: BC Managed Care – PPO | Admitting: Ophthalmology

## 2022-01-12 ENCOUNTER — Encounter (INDEPENDENT_AMBULATORY_CARE_PROVIDER_SITE_OTHER): Payer: Self-pay | Admitting: Ophthalmology

## 2022-01-12 ENCOUNTER — Ambulatory Visit (INDEPENDENT_AMBULATORY_CARE_PROVIDER_SITE_OTHER): Payer: BC Managed Care – PPO | Admitting: Ophthalmology

## 2022-01-12 DIAGNOSIS — H35033 Hypertensive retinopathy, bilateral: Secondary | ICD-10-CM

## 2022-01-12 DIAGNOSIS — H4311 Vitreous hemorrhage, right eye: Secondary | ICD-10-CM | POA: Diagnosis not present

## 2022-01-12 DIAGNOSIS — I10 Essential (primary) hypertension: Secondary | ICD-10-CM

## 2022-01-12 DIAGNOSIS — Z961 Presence of intraocular lens: Secondary | ICD-10-CM

## 2022-01-12 DIAGNOSIS — H43811 Vitreous degeneration, right eye: Secondary | ICD-10-CM

## 2022-04-02 ENCOUNTER — Telehealth: Payer: Self-pay

## 2022-04-02 NOTE — Telephone Encounter (Signed)
Surgical clearance form received from [place]. Pt needing R total hip arthroplasty with Dr. Berenice Primas. Surgery date: TBD. Pt will need appt for clearance.

## 2022-04-15 ENCOUNTER — Encounter: Payer: Self-pay | Admitting: Internal Medicine

## 2022-04-15 ENCOUNTER — Ambulatory Visit: Payer: BC Managed Care – PPO | Admitting: Internal Medicine

## 2022-04-15 VITALS — BP 136/80 | HR 88 | Temp 98.4°F | Resp 16 | Ht 63.0 in | Wt 206.2 lb

## 2022-04-15 DIAGNOSIS — Z01818 Encounter for other preprocedural examination: Secondary | ICD-10-CM | POA: Diagnosis not present

## 2022-04-15 DIAGNOSIS — M159 Polyosteoarthritis, unspecified: Secondary | ICD-10-CM | POA: Diagnosis not present

## 2022-04-15 DIAGNOSIS — I10 Essential (primary) hypertension: Secondary | ICD-10-CM

## 2022-04-15 LAB — CBC WITH DIFFERENTIAL/PLATELET
Basophils Absolute: 0.1 10*3/uL (ref 0.0–0.1)
Basophils Relative: 1.2 % (ref 0.0–3.0)
Eosinophils Absolute: 0.1 10*3/uL (ref 0.0–0.7)
Eosinophils Relative: 2.2 % (ref 0.0–5.0)
HCT: 42.4 % (ref 36.0–46.0)
Hemoglobin: 14.1 g/dL (ref 12.0–15.0)
Lymphocytes Relative: 30.2 % (ref 12.0–46.0)
Lymphs Abs: 1.7 10*3/uL (ref 0.7–4.0)
MCHC: 33.2 g/dL (ref 30.0–36.0)
MCV: 85.2 fl (ref 78.0–100.0)
Monocytes Absolute: 0.5 10*3/uL (ref 0.1–1.0)
Monocytes Relative: 9.1 % (ref 3.0–12.0)
Neutro Abs: 3.2 10*3/uL (ref 1.4–7.7)
Neutrophils Relative %: 57.3 % (ref 43.0–77.0)
Platelets: 250 10*3/uL (ref 150.0–400.0)
RBC: 4.98 Mil/uL (ref 3.87–5.11)
RDW: 13.4 % (ref 11.5–15.5)
WBC: 5.5 10*3/uL (ref 4.0–10.5)

## 2022-04-15 LAB — BASIC METABOLIC PANEL
BUN: 13 mg/dL (ref 6–23)
CO2: 27 mEq/L (ref 19–32)
Calcium: 9.6 mg/dL (ref 8.4–10.5)
Chloride: 104 mEq/L (ref 96–112)
Creatinine, Ser: 0.94 mg/dL (ref 0.40–1.20)
GFR: 65.29 mL/min (ref >60.00)
Glucose, Bld: 101 mg/dL — ABNORMAL HIGH (ref 70–99)
Potassium: 4.3 mEq/L (ref 3.5–5.1)
Sodium: 140 mEq/L (ref 135–145)

## 2022-04-15 NOTE — Assessment & Plan Note (Signed)
R hip DJD: Symptoms as described above, she is ready to proceed with R hip replacement with Dr. Berenice Primas.  Physical exam is benign, EKG no acute and unchanged from previous. Plan: She is cleared to proceed with surgery. HTN: BP today is very good, check BMP and CBC, continue losartan, metoprolol. Preventive care: Had a flu shot and a COVID-vaccine.  We talk about RSV and plans to proceed.  Saw gynecology, had a colonoscopy 2023. RTC CPX in 3 to 4 months

## 2022-04-15 NOTE — Patient Instructions (Signed)
Proceed with the RSV vaccine at your convenience.  You can do your physical in 3 to 4 months from today.  Please schedule it at the front desk.   GO TO THE LAB : Get the blood work

## 2022-04-15 NOTE — Telephone Encounter (Signed)
Surgical clearance form completed and faxed to Attn: Marcelo Baldy at [place] at [phone] w/ ov notes, labs and EKG from todays visit. Form sent for scanning.

## 2022-04-15 NOTE — Telephone Encounter (Signed)
Received fax confirmation

## 2022-04-15 NOTE — Progress Notes (Signed)
Subjective:    Patient ID: Taylor Holden, female    DOB: 23-May-1960, 62 y.o.   MRN: [id number]  DOS:  04/15/2022 Type of visit - description: Surgical clearance  The patient has chronic right hip pain, gradually worse. Saw orthopedics several months ago, got a local injection, that helped the pain temporarily.  At the time she was able to go to [place], did a lot of walking and did not have chest pain or difficulty breathing. Once the local injection effect stopped, pain came back and is affecting her ADLs.  The only limiting factor for exercise is pain in the hip. Denies chest pain or difficulty breathing. No lower extremity edema No palpitations  Wt Readings from Last 3 Encounters:  04/15/22 206 lb 4 oz (93.6 kg)  07/09/21 206 lb (93.4 kg)  06/25/21 200 lb (90.7 kg)    Review of Systems See above   Past Medical History:  Diagnosis Date   Adrenal adenoma    Allergy    Aortic atherosclerosis (HCC)    Arthritis    Early menopause    Fatty liver    Glaucoma    History of kidney stones    Hyperlipidemia    Hypertension    Vitamin D deficiency     Past Surgical History:  Procedure Laterality Date   CATARACT EXTRACTION, BILATERAL Bilateral 07/2020   CESAREAN SECTION  04/1998   x1   CHOLECYSTECTOMY  08/1998   EYE SURGERY Right 01/2014   for glaucoma   GLAUCOMA SURGERY Bilateral 03/2020   WISDOM TOOTH EXTRACTION      Current Outpatient Medications  Medication Instructions   acetaminophen (TYLENOL) 1,000 mg, Oral, Every 8 hours PRN   latanoprost (XALATAN) 0.005 % ophthalmic solution 1 drop, Both Eyes, Daily at bedtime   losartan (COZAAR) 100 mg, Oral, Daily   metoprolol succinate (TOPROL-XL) 50 MG 24 hr tablet TAKE ONE (1) TABLET BY MOUTH EVERY DAY IMMEDIATELY FOLLOWING A MEAL.   Multiple Vitamins-Minerals (CENTRUM SILVER 50+WOMEN PO) Oral       Objective:   Physical Exam BP 136/80   Pulse 88   Temp 98.4 F (36.9 C) (Oral)   Resp 16   Ht '5\' 3"'$  (1.6 m)    Wt 206 lb 4 oz (93.6 kg)   SpO2 96%   BMI 36.54 kg/m  General:   Well developed, NAD, BMI noted. Neck: No JVD at 45 degrees HEENT:  Normocephalic . Face symmetric, atraumatic Lungs:  CTA B Normal respiratory effort, no intercostal retractions, no accessory muscle use. Heart: RRR,  no murmur.  Lower extremities: no pretibial edema bilaterally  Skin: Not pale. Not jaundice Neurologic:  alert & oriented X3.  Speech normal, gait appropriate for age and unassisted Psych--  Cognition and judgment appear intact.  Cooperative with normal attention span and concentration.  Behavior appropriate. No anxious or depressed appearing.      Assessment    Assessment  New pt to me 10-02-15 HTN -- rx meds 2001; in the past hctz caused low K Obesity Hyperlipidemia Mildly elevated LFTs.  Saw GI 03/2017, additional labs done: normal/negative.  [place] c/w  fatty liver; liver Bx 05/2017: Steatohepatitis Glaucoma  Early menopause, onset 2011 Vitamin D deficiency Allergies H/o  kidney stones  PLAN R hip DJD: Symptoms as described above, she is ready to proceed with R hip replacement with Dr. Berenice Primas.  Physical exam is benign, EKG no acute and unchanged from previous. Plan: She is cleared to proceed with surgery. HTN: BP  today is very good, check BMP and CBC, continue losartan, metoprolol. Preventive care: Had a flu shot and a COVID-vaccine.  We talk about RSV and plans to proceed.  Saw gynecology, had a colonoscopy 2023. RTC CPX in 3 to 4 months

## 2022-05-15 HISTORY — PX: TOTAL HIP ARTHROPLASTY: SHX124

## 2022-05-29 ENCOUNTER — Encounter: Payer: BC Managed Care – PPO | Admitting: Internal Medicine

## 2022-06-25 ENCOUNTER — Other Ambulatory Visit: Payer: Self-pay | Admitting: Internal Medicine

## 2022-08-12 ENCOUNTER — Other Ambulatory Visit: Payer: Self-pay | Admitting: Internal Medicine

## 2022-08-24 ENCOUNTER — Encounter: Payer: Self-pay | Admitting: Internal Medicine

## 2022-08-24 ENCOUNTER — Ambulatory Visit (INDEPENDENT_AMBULATORY_CARE_PROVIDER_SITE_OTHER): Payer: BC Managed Care – PPO | Admitting: Internal Medicine

## 2022-08-24 VITALS — BP 138/82 | HR 82 | Ht 63.0 in | Wt 208.8 lb

## 2022-08-24 DIAGNOSIS — Z0000 Encounter for general adult medical examination without abnormal findings: Secondary | ICD-10-CM | POA: Diagnosis not present

## 2022-08-24 DIAGNOSIS — E785 Hyperlipidemia, unspecified: Secondary | ICD-10-CM

## 2022-08-24 DIAGNOSIS — I10 Essential (primary) hypertension: Secondary | ICD-10-CM

## 2022-08-24 DIAGNOSIS — E559 Vitamin D deficiency, unspecified: Secondary | ICD-10-CM

## 2022-08-24 LAB — CBC WITH DIFFERENTIAL/PLATELET
Basophils Absolute: 0.1 10*3/uL (ref 0.0–0.1)
Basophils Relative: 1.3 % (ref 0.0–3.0)
Eosinophils Absolute: 0.1 10*3/uL (ref 0.0–0.7)
Eosinophils Relative: 2 % (ref 0.0–5.0)
HCT: 43.9 % (ref 36.0–46.0)
Hemoglobin: 14.4 g/dL (ref 12.0–15.0)
Lymphocytes Relative: 27.9 % (ref 12.0–46.0)
Lymphs Abs: 1.9 10*3/uL (ref 0.7–4.0)
MCHC: 32.7 g/dL (ref 30.0–36.0)
MCV: 84 fl (ref 78.0–100.0)
Monocytes Absolute: 0.6 10*3/uL (ref 0.1–1.0)
Monocytes Relative: 9.2 % (ref 3.0–12.0)
Neutro Abs: 4.2 10*3/uL (ref 1.4–7.7)
Neutrophils Relative %: 59.6 % (ref 43.0–77.0)
Platelets: 262 10*3/uL (ref 150.0–400.0)
RBC: 5.22 Mil/uL — ABNORMAL HIGH (ref 3.87–5.11)
RDW: 14 % (ref 11.5–15.5)
WBC: 7 10*3/uL (ref 4.0–10.5)

## 2022-08-24 LAB — LIPID PANEL
Cholesterol: 204 mg/dL — ABNORMAL HIGH (ref 0–200)
HDL: 69.4 mg/dL (ref >39.00)
LDL Cholesterol: 109 mg/dL — ABNORMAL HIGH (ref 0–99)
NonHDL: 135.09
Total CHOL/HDL Ratio: 3
Triglycerides: 128 mg/dL (ref 0.0–149.0)
VLDL: 25.6 mg/dL (ref 0.0–40.0)

## 2022-08-24 LAB — COMPREHENSIVE METABOLIC PANEL
ALT: 27 U/L (ref 0–35)
AST: 22 U/L (ref 0–37)
Albumin: 4.2 g/dL (ref 3.5–5.2)
Alkaline Phosphatase: 115 U/L (ref 39–117)
BUN: 14 mg/dL (ref 6–23)
CO2: 27 mEq/L (ref 19–32)
Calcium: 9.7 mg/dL (ref 8.4–10.5)
Chloride: 102 mEq/L (ref 96–112)
Creatinine, Ser: 0.89 mg/dL (ref 0.40–1.20)
GFR: 69.55 mL/min (ref >60.00)
Glucose, Bld: 93 mg/dL (ref 70–99)
Potassium: 4.1 mEq/L (ref 3.5–5.1)
Sodium: 139 mEq/L (ref 135–145)
Total Bilirubin: 0.6 mg/dL (ref 0.2–1.2)
Total Protein: 7.2 g/dL (ref 6.0–8.3)

## 2022-08-24 LAB — VITAMIN D 25 HYDROXY (VIT D DEFICIENCY, FRACTURES): VITD: 22.61 ng/mL — ABNORMAL LOW (ref 30.00–100.00)

## 2022-08-24 NOTE — Assessment & Plan Note (Signed)
-   Td 05-2020 - s/p Shingrix - RSV 04-21-2022 -Vaccines I recommend: COVID booster if not done by 12-2021, flu shot every fall. -Female care:  PAP 11/2021 ([facility]), MMG 11/2021 ([facility]) - Bones: DEXA WNL 05-2018 -CCS:  Cscope 2014, unable to get records.    cscope 06/2018: polyps-tics.  C-scope 11-2021, next per GI -Labs: CMP FLP vitamin D CBC -Lifestyle: s/p  right hip replacement, encouraged gradual increase to exercise 3 hours a week.  Will also discussed a healthy diet. - Healthcare POA: Discussed.

## 2022-08-24 NOTE — Patient Instructions (Signed)
Take vitamin D3: At least 2000 units every day  Vaccines I recommend: COVID booster if not done by 12-2021, flu shot every fall.   Check the  blood pressure regularly BP GOAL is between 110/65 and  135/85. If it is consistently higher or lower, let me know   GO TO THE LAB : Get the blood work     GO TO THE FRONT DESK, PLEASE SCHEDULE YOUR APPOINTMENTS Come back for checkup in 6 months    "Health Care Power of attorney" ,  "Living will" (Advance care planning documents)  If you already have a living will or healthcare power of attorney, is recommended you bring the copy to be scanned in your chart.   The document will be available to all the doctors you see in the system.  Advance care planning is a process that supports adults in  understanding and sharing their preferences regarding future medical care.  The patient's preferences are recorded in documents called Advance Directives and the can be modified at any time while the patient is in full mental capacity.   If you don't have one, please consider create one.      More information at: [email or web address]

## 2022-08-24 NOTE — Assessment & Plan Note (Signed)
Here for CPX.  See separate documentation HTN: On losartan, metoprolol.  Ambulatory BPs are well within normal.  No change, checking labs Hyperlipidemia: Diet controlled, checking labs DJD: Had a right hip replacement few weeks ago, recuperating well.  Taking Celebrex only occasionally. Vitamin D deficiency: On a  multivitamin daily and vitamin D3 of 1000 units daily.  Recommend to take 2000 units daily.  History of intolerance to ergocalciferol due to nosebleeds. RTC 6 months

## 2022-08-24 NOTE — Progress Notes (Signed)
Subjective:    Patient ID: Taylor Holden, female    DOB: Jun 19, 1960, 62 y.o.   MRN: [id number]  DOS:  08/24/2022 Type of visit - description: cpx  Here for CPX Since the last visit he had a hip replacement, recuperating really well. Currently asymptomatic.   Review of Systems  Other than above, a 14 point review of systems is negative     Past Medical History:  Diagnosis Date   Adrenal adenoma    Allergy    Aortic atherosclerosis (HCC)    Arthritis    Early menopause    Fatty liver    Glaucoma    History of kidney stones    Hyperlipidemia    Hypertension    Vitamin D deficiency     Past Surgical History:  Procedure Laterality Date   CATARACT EXTRACTION, BILATERAL Bilateral 07/2020   CESAREAN SECTION  04/1998   x1   CHOLECYSTECTOMY  08/1998   EYE SURGERY Right 01/2014   for glaucoma   GLAUCOMA SURGERY Bilateral 03/2020   TOTAL HIP ARTHROPLASTY Right 05/15/2022   WISDOM TOOTH EXTRACTION     Social History   Socioeconomic History   Marital status: Married    Spouse name: Not on file   Number of children: 1   Years of education: Not on file   Highest education level: Not on file  Occupational History   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, site , retiring 2019  Tobacco Use   Smoking status: Never   Smokeless tobacco: Never  Vaping Use   Vaping Use: Never used  Substance and Sexual Activity   Alcohol use: Never    Alcohol/week: 0.0 standard drinks of alcohol   Drug use: No   Sexual activity: Not Currently    Partners: Male    Birth control/protection: Post-menopausal  Other Topics Concern   Not on file  Social History Narrative   Household: pt, husband , son   Son 2000, [employer]: graduated;  dx w/ Crohn's Dz 04-2019   Social Determinants of Health   Financial Resource Strain: Not on file  Food Insecurity: Not on file  Transportation Needs: Not on file  Physical Activity: Not on file  Stress: Not on file  Social Connections: Not on  file  Intimate Partner Violence: Not on file    Current Outpatient Medications  Medication Instructions   acetaminophen (TYLENOL) 1,000 mg, Oral, Every 8 hours PRN   CeleBREX 200 mg, Oral, 2 times daily, As needed    latanoprost (XALATAN) 0.005 % ophthalmic solution 1 drop, Both Eyes, Daily at bedtime   losartan (COZAAR) 100 mg, Oral, Daily   metoprolol succinate (TOPROL-XL) 50 mg, Oral, Daily, Take with or immediately following a meal   Multiple Vitamins-Minerals (CENTRUM SILVER 50+WOMEN PO) Oral       Objective:   Physical Exam BP 138/82   Pulse 82   Ht 5\' 3"  (1.6 m)   Wt 208 lb 12.8 oz (94.7 kg)   SpO2 98%   BMI 36.99 kg/m  General: Well developed, NAD, BMI noted Neck: No  thyromegaly  HEENT:  Normocephalic . Face symmetric, atraumatic Lungs:  CTA B Normal respiratory effort, no intercostal retractions, no accessory muscle use. Heart: RRR,  no murmur.  Abdomen:  Not distended, soft, non-tender. No rebound or rigidity.   Lower extremities: no pretibial edema bilaterally  Skin: Exposed areas without rash. Not pale. Not jaundice Neurologic:  alert & oriented X3.  Speech normal, gait appropriate for age and unassisted.  Transfers  somewhat limited by DJD Strength symmetric and appropriate for age.  Psych: Cognition and judgment appear intact.  Cooperative with normal attention span and concentration.  Behavior appropriate. No anxious or depressed appearing.     Assessment     Assessment  New pt to me 10-02-15 HTN -- rx meds 2001; in the past hctz caused low K Obesity Hyperlipidemia Mildly elevated LFTs.  Saw GI 03/2017, additional labs done: normal/negative.  [place] c/w  fatty liver; liver Bx 05/2017: Steatohepatitis Glaucoma  Early menopause, onset 2011 Vitamin D deficiency (ergocalciferol =  nosebleeds) Allergies H/o  kidney stones  PLAN Here for CPX.  See separate documentation HTN: On losartan, metoprolol.  Ambulatory BPs are well within normal.  No change,  checking labs Hyperlipidemia: Diet controlled, checking labs DJD: Had a right hip replacement few weeks ago, recuperating well.  Taking Celebrex only occasionally. Vitamin D deficiency: On a  multivitamin daily and vitamin D3 of 1000 units daily.  Recommend to take 2000 units daily.  History of intolerance to ergocalciferol due to nosebleeds. RTC 6 months

## 2022-08-26 MED ORDER — VITAMIN D (ERGOCALCIFEROL) 1.25 MG (50000 UNIT) PO CAPS: 50000.0000 [IU] | ORAL_CAPSULE | ORAL | 0 refills | Status: AC

## 2022-08-26 NOTE — Addendum Note (Signed)
Addended byConrad Du Quoin D on: 08/26/2022 07:54 AM   Modules accepted: Orders

## 2022-11-06 ENCOUNTER — Telehealth: Payer: Self-pay | Admitting: Internal Medicine

## 2022-11-06 MED ORDER — NIRMATRELVIR/RITONAVIR (PAXLOVID)TABLET: 3.0000 | ORAL_TABLET | Freq: Two times a day (BID) | ORAL | 0 refills | Status: AC

## 2022-11-06 NOTE — Telephone Encounter (Signed)
Pt states she tested + for covid today, sxs stated Monday. She has congestion, headache, and fatigue. She would like to know if pcp could send in the antiviral. Advised pt this may not be possible as it is short notice and no available appts left. If pcp is not able to call this in, pt was made aware of our on demand visits through mychart.    [facility] - [place], [place] - [address], [place] [address] Phone: [phone]  Fax: [phone]

## 2022-11-06 NOTE — Telephone Encounter (Signed)
Please advise 

## 2022-11-06 NOTE — Telephone Encounter (Signed)
Last kidney function within 3 months are normal. Send Paxlovid.  Needs to start no later than tomorrow morning Advised patient to rest, drink plenty of fluids, Tylenol as needed. If possible check O2 sats, if they are consistently less than 94%: Seek medical attention If not gradually better needs office visit If severe symptoms: ER

## 2022-11-06 NOTE — Telephone Encounter (Signed)
Spoke w/ Pt- informed of recommendations. Pt verbalized understanding. Rx sent.  

## 2022-12-07 LAB — HM MAMMOGRAPHY

## 2022-12-17 ENCOUNTER — Other Ambulatory Visit: Payer: Self-pay | Admitting: Internal Medicine

## 2023-02-02 ENCOUNTER — Other Ambulatory Visit: Payer: Self-pay | Admitting: Internal Medicine

## 2023-02-16 ENCOUNTER — Encounter: Payer: Self-pay | Admitting: Internal Medicine

## 2023-02-17 ENCOUNTER — Ambulatory Visit: Payer: BC Managed Care – PPO | Admitting: Internal Medicine

## 2023-02-17 ENCOUNTER — Encounter: Payer: Self-pay | Admitting: Internal Medicine

## 2023-02-17 VITALS — BP 132/82 | HR 82 | Temp 97.9°F | Resp 16 | Ht 63.0 in | Wt 209.5 lb

## 2023-02-17 DIAGNOSIS — I10 Essential (primary) hypertension: Secondary | ICD-10-CM | POA: Diagnosis not present

## 2023-02-17 DIAGNOSIS — E785 Hyperlipidemia, unspecified: Secondary | ICD-10-CM

## 2023-02-17 DIAGNOSIS — E66811 Obesity, class 1: Secondary | ICD-10-CM | POA: Diagnosis not present

## 2023-02-17 DIAGNOSIS — E559 Vitamin D deficiency, unspecified: Secondary | ICD-10-CM

## 2023-02-17 MED ORDER — LOSARTAN POTASSIUM 100 MG PO TABS: 100.0000 mg | ORAL_TABLET | Freq: Every day | ORAL | 1 refills | Status: DC

## 2023-02-17 MED ORDER — METOPROLOL SUCCINATE ER 50 MG PO TB24: 50.0000 mg | ORAL_TABLET | Freq: Every day | ORAL | 1 refills | Status: DC

## 2023-02-17 MED ORDER — VITAMIN D3 25 MCG (1000 UT) PO CAPS: 1000.0000 [IU] | ORAL_CAPSULE | Freq: Every day | ORAL | Status: AC

## 2023-02-17 NOTE — Patient Instructions (Addendum)
  Check the  blood pressure regularly Blood pressure goal:  between 110/65 and  135/85. If it is consistently higher or lower, let me know      Next visit with me  by 08/2023 for a physical exam   Please schedule it at the front desk

## 2023-02-17 NOTE — Progress Notes (Unsigned)
Subjective:    Patient ID: Taylor Holden, female    DOB: 25-Oct-1960, 62 y.o.   MRN: [id number]  DOS:  02/17/2023 Type of visit - description: f/u  Since the last office visit is doing well. Has no major concerns. She is now able to  be more active since he had hip surgeries. Preventive care reviewed. Wt Readings from Last 3 Encounters:  02/17/23 209 lb 8 oz (95 kg)  08/24/22 208 lb 12.8 oz (94.7 kg)  04/15/22 206 lb 4 oz (93.6 kg)    Review of Systems See above   Past Medical History:  Diagnosis Date   Adrenal adenoma    Allergy    Aortic atherosclerosis (HCC)    Arthritis    Early menopause    Fatty liver    Glaucoma    History of kidney stones    Hyperlipidemia    Hypertension    Vitamin D deficiency     Past Surgical History:  Procedure Laterality Date   CATARACT EXTRACTION, BILATERAL Bilateral 07/2020   CESAREAN SECTION  04/1998   x1   CHOLECYSTECTOMY  08/1998   EYE SURGERY Right 01/2014   for glaucoma   GLAUCOMA SURGERY Bilateral 03/2020   TOTAL HIP ARTHROPLASTY Right 05/15/2022   WISDOM TOOTH EXTRACTION      Current Outpatient Medications  Medication Instructions   acetaminophen (TYLENOL) 1,000 mg, Oral, Every 8 hours PRN   CeleBREX 200 mg, Oral, 2 times daily, As needed    latanoprost (XALATAN) 0.005 % ophthalmic solution 1 drop, Both Eyes, Daily at bedtime   losartan (COZAAR) 100 mg, Oral, Daily   metoprolol succinate (TOPROL-XL) 50 mg, Oral, Daily, Take with or immediately following a meal   Multiple Vitamins-Minerals (CENTRUM SILVER 50+WOMEN PO) Oral       Objective:   Physical Exam BP 132/82   Pulse 82   Temp 97.9 F (36.6 C) (Oral)   Resp 16   Ht 5\' 3"  (1.6 m)   Wt 209 lb 8 oz (95 kg)   SpO2 97%   BMI 37.11 kg/m  General:   Well developed, NAD, BMI noted. HEENT:  Normocephalic . Face symmetric, atraumatic Lungs:  CTA B Normal respiratory effort, no intercostal retractions, no accessory muscle use. Heart: RRR,  no murmur.   Lower extremities: no pretibial edema bilaterally  Skin: Not pale. Not jaundice Neurologic:  alert & oriented X3.  Speech normal, gait appropriate for age and unassisted.  Transfer still somewhat limited by MSK issues Psych--  Cognition and judgment appear intact.  Cooperative with normal attention span and concentration.  Behavior appropriate. No anxious or depressed appearing.      Assessment     Assessment  New pt to me 10-02-15 HTN -- rx meds 2001; in the past hctz caused low K Obesity Hyperlipidemia Mildly elevated LFTs.  Saw GI 03/2017, additional labs done: normal/negative.  [place] c/w  fatty liver; liver Bx 05/2017: Steatohepatitis Glaucoma  Early menopause, onset 2011 Vitamin D deficiency (ergocalciferol =  nosebleeds) Allergies H/o  kidney stones  PLAN  HTN: BP satisfactory, continue losartan, metoprolol, last BMP okay.  Encouraged to check BPs frequently at home, goals provided. Hyperlipidemia: Diet controlled, last LDL improved. Vitamin D deficiency: She is intolerant to ergocalciferol, taking vitamin D 1000 units daily an additional 1000 units from her multivitamin.  Recheck on RTC. Obesity: Able to be more active lately, her husband has changed his diet and she is also working on it. Preventive care: Saw gynecology and  had a mammogram 11-2022. COVID and flu shot: 01/21/2023. RTC 08/2023 CPX   Here for CPX.  See separate documentation HTN: On losartan, metoprolol.  Ambulatory BPs are well within normal.  No change, checking labs Hyperlipidemia: Diet controlled, checking labs DJD: Had a right hip replacement few weeks ago, recuperating well.  Taking Celebrex only occasionally. Vitamin D deficiency: On a  multivitamin daily and vitamin D3 of 1000 units daily.  Recommend to take 2000 units daily.  History of intolerance to ergocalciferol due to nosebleeds. RTC 6 months

## 2023-02-18 NOTE — Assessment & Plan Note (Signed)
HTN: BP satisfactory, continue losartan, metoprolol, last BMP okay.  Encouraged to check BPs frequently at home, goals provided. Hyperlipidemia: Diet controlled, last LDL improved. Vitamin D deficiency: She is intolerant to ergocalciferol, taking  vitamin D 1000 units daily and additional 1000 units from her multivitamin.  Recheck on RTC. Obesity: Able to be more active lately, her husband has changed his diet and she is also working on it. Preventive care: Saw gynecology and had a mammogram 11-2022. COVID and flu shot: 01/21/2023. RTC 08/2023 CPX

## 2023-02-23 ENCOUNTER — Encounter: Payer: Self-pay | Admitting: Internal Medicine

## 2023-08-25 ENCOUNTER — Ambulatory Visit (INDEPENDENT_AMBULATORY_CARE_PROVIDER_SITE_OTHER): Payer: BC Managed Care – PPO | Admitting: Internal Medicine

## 2023-08-25 ENCOUNTER — Encounter: Payer: Self-pay | Admitting: Internal Medicine

## 2023-08-25 VITALS — BP 130/82 | HR 73 | Temp 98.2°F | Resp 16 | Ht 63.0 in | Wt 212.5 lb

## 2023-08-25 DIAGNOSIS — I10 Essential (primary) hypertension: Secondary | ICD-10-CM | POA: Diagnosis not present

## 2023-08-25 DIAGNOSIS — E785 Hyperlipidemia, unspecified: Secondary | ICD-10-CM

## 2023-08-25 DIAGNOSIS — E559 Vitamin D deficiency, unspecified: Secondary | ICD-10-CM

## 2023-08-25 DIAGNOSIS — Z0000 Encounter for general adult medical examination without abnormal findings: Secondary | ICD-10-CM | POA: Diagnosis not present

## 2023-08-25 LAB — COMPREHENSIVE METABOLIC PANEL WITH GFR
ALT: 41 U/L — ABNORMAL HIGH (ref 0–35)
AST: 25 U/L (ref 0–37)
Albumin: 4.1 g/dL (ref 3.5–5.2)
Alkaline Phosphatase: 106 U/L (ref 39–117)
BUN: 12 mg/dL (ref 6–23)
CO2: 29 meq/L (ref 19–32)
Calcium: 9.3 mg/dL (ref 8.4–10.5)
Chloride: 102 meq/L (ref 96–112)
Creatinine, Ser: 0.95 mg/dL (ref 0.40–1.20)
GFR: 63.86 mL/min (ref >60.00)
Glucose, Bld: 102 mg/dL — ABNORMAL HIGH (ref 70–99)
Potassium: 4.6 meq/L (ref 3.5–5.1)
Sodium: 138 meq/L (ref 135–145)
Total Bilirubin: 0.8 mg/dL (ref 0.2–1.2)
Total Protein: 6.8 g/dL (ref 6.0–8.3)

## 2023-08-25 LAB — CBC WITH DIFFERENTIAL/PLATELET
Basophils Absolute: 0 10*3/uL (ref 0.0–0.1)
Basophils Relative: 0.4 % (ref 0.0–3.0)
Eosinophils Absolute: 0.2 10*3/uL (ref 0.0–0.7)
Eosinophils Relative: 2 % (ref 0.0–5.0)
HCT: 44.8 % (ref 36.0–46.0)
Hemoglobin: 14.6 g/dL (ref 12.0–15.0)
Lymphocytes Relative: 24.6 % (ref 12.0–46.0)
Lymphs Abs: 2.1 10*3/uL (ref 0.7–4.0)
MCHC: 32.6 g/dL (ref 30.0–36.0)
MCV: 86.3 fl (ref 78.0–100.0)
Monocytes Absolute: 0.7 10*3/uL (ref 0.1–1.0)
Monocytes Relative: 7.9 % (ref 3.0–12.0)
Neutro Abs: 5.6 10*3/uL (ref 1.4–7.7)
Neutrophils Relative %: 65.1 % (ref 43.0–77.0)
Platelets: 251 10*3/uL (ref 150.0–400.0)
RBC: 5.19 Mil/uL — ABNORMAL HIGH (ref 3.87–5.11)
RDW: 13.7 % (ref 11.5–15.5)
WBC: 8.6 10*3/uL (ref 4.0–10.5)

## 2023-08-25 LAB — LIPID PANEL
Cholesterol: 204 mg/dL — ABNORMAL HIGH (ref 0–200)
HDL: 67 mg/dL (ref >39.00)
LDL Cholesterol: 109 mg/dL — ABNORMAL HIGH (ref 0–99)
NonHDL: 136.64
Total CHOL/HDL Ratio: 3
Triglycerides: 136 mg/dL (ref 0.0–149.0)
VLDL: 27.2 mg/dL (ref 0.0–40.0)

## 2023-08-25 LAB — VITAMIN D 25 HYDROXY (VIT D DEFICIENCY, FRACTURES): VITD: 26.34 ng/mL — ABNORMAL LOW (ref 30.00–100.00)

## 2023-08-25 NOTE — Patient Instructions (Signed)
 Will call you when the bone density test machine is ready  Check the  blood pressure regularly Blood pressure goal:  between 110/65 and  135/85. If it is consistently higher or lower, let me know     GO TO THE LAB : Get the blood work     Next office visit for a checkup in 6 months Please make an appointment before you leave today

## 2023-08-25 NOTE — Progress Notes (Unsigned)
 Subjective:    Patient ID: Taylor Holden, female    DOB: May 27, 1960, 63 y.o.   MRN: [id number]  DOS:  08/25/2023 Type of visit - description: Here for CPX Here for CPX. Feeling well. Having problems with her left knee.  Wt Readings from Last 3 Encounters:  08/25/23 212 lb 8 oz (96.4 kg)  02/17/23 209 lb 8 oz (95 kg)  08/24/22 208 lb 12.8 oz (94.7 kg)     Review of Systems See above   Past Medical History:  Diagnosis Date   Adrenal adenoma    Allergy    Aortic atherosclerosis (HCC)    Arthritis    Early menopause    Fatty liver    Glaucoma    History of kidney stones    Hyperlipidemia    Hypertension    Vitamin D  deficiency     Past Surgical History:  Procedure Laterality Date   CATARACT EXTRACTION, BILATERAL Bilateral 07/2020   CESAREAN SECTION  04/1998   x1   CHOLECYSTECTOMY  08/1998   EYE SURGERY Right 01/2014   for glaucoma   GLAUCOMA SURGERY Bilateral 03/2020   TOTAL HIP ARTHROPLASTY Right 05/15/2022   WISDOM TOOTH EXTRACTION      Current Outpatient Medications  Medication Instructions   acetaminophen (TYLENOL) 1,000 mg, Every 8 hours PRN   CeleBREX 200 mg, 2 times daily   latanoprost (XALATAN) 0.005 % ophthalmic solution 1 drop, Daily at bedtime   losartan  (COZAAR ) 100 mg, Oral, Daily   metoprolol  succinate (TOPROL -XL) 50 mg, Oral, Daily, Take with or immediately following a meal   Multiple Vitamins-Minerals (CENTRUM SILVER 50+WOMEN PO) Take by mouth.   Vitamin D3 1,000 Units, Oral, Daily       Objective:   Physical Exam BP 130/82   Pulse 73   Temp 98.2 F (36.8 C) (Oral)   Resp 16   Ht 5\' 3"  (1.6 m)   Wt 212 lb 8 oz (96.4 kg)   SpO2 95%   BMI 37.64 kg/m  General: Well developed, NAD, BMI noted Neck: No  thyromegaly  HEENT:  Normocephalic . Face symmetric, atraumatic Lungs:  CTA B Normal respiratory effort, no intercostal retractions, no accessory muscle use. Heart: RRR,  no murmur.  Abdomen:  Not distended, soft, non-tender. No  rebound or rigidity.   Lower extremities: no pretibial edema bilaterally  Skin: Exposed areas without rash. Not pale. Not jaundice Neurologic:  alert & oriented X3.  Speech normal, gait somewhat limited by left knee pain Strength symmetric and appropriate for age.  Psych: Cognition and judgment appear intact.  Cooperative with normal attention span and concentration.  Behavior appropriate. No anxious or depressed appearing.     Assessment   Assessment  New pt to me 10-02-15 HTN -- rx meds 2001; in the past hctz caused low K Obesity Hyperlipidemia Mildly elevated LFTs.  Saw GI 03/2017, additional labs done: normal/negative.  [place]  c/w  fatty liver; liver Bx 05/2017: Steatohepatitis Glaucoma  Early menopause, onset 2011 Vitamin D  deficiency (ergocalciferol  =  nosebleeds) Allergies H/o  kidney stones  PLAN For CPX -Td 05-2020 - s/p Shingrix ; s/p RSV 04-21-2022 -Female care:  PAP 11/2021 ([facility]), MMG 11/2022 ([facility]).  Plans to call gynecologist to get an appointment on a mammogram - Bones: DEXA WNL 05-2018.  Repeat DEXA (machine is down, will reach out to her once it is repaired) -CCS:  Cscope 2014, unable to get records.    cscope 06/2018: polyps-tics.  C-scope 11-2021, next per GI -Labs: CMP  FLP vitamin D  CBC. -Lifestyle: Encouraged healthy diet, exercise somewhat limited by left knee pain. Other issues addressed today: HTN: BP looks good, on losartan , metoprolol , checking labs, recommend to check ambulatory BPs. Hyperlipidemia: Diet controlled, last 10-year cardiovascular risk around 5.6%.  Rechecking. Obesity: Unable to exercise much, healthy diet is emphasized. Vitamin D  deficiency: Ergocalciferol  intolerant, on vitamin D  1000 units daily, checking levels. DJD: Current pain left knee.  Under Ortho.  Takes Celebrex as needed. RTC 6 months  11 HTN: BP satisfactory, continue losartan , metoprolol , last BMP okay.  Encouraged to check BPs frequently at home, goals provided. Hyperlipidemia:  Diet controlled, last LDL improved. Vitamin D  deficiency: She is intolerant to ergocalciferol , taking  vitamin D  1000 units daily and additional 1000 units from her multivitamin.  Recheck on RTC. Obesity: Able to be more active lately, her husband has changed his diet and she is also working on it. Preventive care: Saw gynecology and had a mammogram 11-2022. COVID and flu shot: 01/21/2023. RTC 08/2023 CPX   The 10-year ASCVD risk score (Arnett DK, et al., 2019) is: 5.6%   Values used to calculate the score:     Age: 5 years     Sex: Female     Is Non-Hispanic African American: No     Diabetic: No     Tobacco smoker: No     Systolic Blood Pressure: 130 mmHg     Is BP treated: Yes     HDL Cholesterol: 69.4 mg/dL     Total Cholesterol: 204 mg/dL

## 2023-08-26 ENCOUNTER — Encounter: Payer: Self-pay | Admitting: Internal Medicine

## 2023-08-26 ENCOUNTER — Ambulatory Visit: Payer: Self-pay | Admitting: Internal Medicine

## 2023-08-26 NOTE — Assessment & Plan Note (Signed)
 Here for CPX  Other issues addressed today: HTN: BP looks good, on losartan , metoprolol , checking labs, recommend to check ambulatory BPs. Hyperlipidemia: Diet controlled, last 10-year cardiovascular risk around 5.6%.  Recheck. Obesity: Unable to exercise much, healthy diet is emphasized. Vitamin D  deficiency: Ergocalciferol  intolerant, on vitamin D  1000 units daily, checking levels. DJD: Current pain left knee.  Under Ortho.  Takes Celebrex as needed. RTC 6 months

## 2023-08-26 NOTE — Assessment & Plan Note (Signed)
 Here for CPX -Td 05-2020 - s/p Shingrix ; s/p RSV 04-21-2022 -Female care:  PAP 11/2021 ([facility]), MMG 11/2022 ([facility]).  Plans to call gynecologist to get an appointment & mammogram - Bones: DEXA WNL 05-2018.  Repeat DEXA (machine is down, will reach out to her once it is repaired) -CCS:  Cscope 2014, unable to get records.    cscope 06/2018: polyps-tics.  C-scope 11-2021, next per GI -Labs: CMP FLP vitamin D  CBC. -Lifestyle: Encouraged healthy diet, exercise somewhat limited by left knee pain.

## 2023-10-06 ENCOUNTER — Other Ambulatory Visit: Payer: Self-pay

## 2023-10-06 DIAGNOSIS — Z780 Asymptomatic menopausal state: Secondary | ICD-10-CM

## 2023-10-25 ENCOUNTER — Other Ambulatory Visit: Payer: Self-pay | Admitting: Internal Medicine

## 2023-11-08 ENCOUNTER — Ambulatory Visit (HOSPITAL_BASED_OUTPATIENT_CLINIC_OR_DEPARTMENT_OTHER)
Admission: RE | Admit: 2023-11-08 | Discharge: 2023-11-08 | Disposition: A | Discharge status: Home / Self Care | Source: Ambulatory Visit | Attending: Internal Medicine | Admitting: Internal Medicine

## 2023-11-08 DIAGNOSIS — Z780 Asymptomatic menopausal state: Secondary | ICD-10-CM | POA: Diagnosis present

## 2023-11-15 ENCOUNTER — Ambulatory Visit: Payer: Self-pay | Admitting: Internal Medicine

## 2023-12-13 ENCOUNTER — Other Ambulatory Visit: Payer: Self-pay | Admitting: Internal Medicine

## 2023-12-13 DIAGNOSIS — C801 Malignant (primary) neoplasm, unspecified: Secondary | ICD-10-CM

## 2023-12-13 HISTORY — DX: Malignant (primary) neoplasm, unspecified: C80.1

## 2023-12-16 ENCOUNTER — Other Ambulatory Visit: Payer: Self-pay | Admitting: Obstetrics and Gynecology

## 2023-12-16 DIAGNOSIS — R928 Other abnormal and inconclusive findings on diagnostic imaging of breast: Secondary | ICD-10-CM

## 2023-12-21 ENCOUNTER — Other Ambulatory Visit: Payer: Self-pay | Admitting: Obstetrics and Gynecology

## 2023-12-21 ENCOUNTER — Ambulatory Visit
Admission: RE | Admit: 2023-12-21 | Discharge: 2023-12-21 | Disposition: A | Discharge status: Home / Self Care | Source: Ambulatory Visit | Attending: Obstetrics and Gynecology | Admitting: Obstetrics and Gynecology

## 2023-12-21 DIAGNOSIS — R921 Mammographic calcification found on diagnostic imaging of breast: Secondary | ICD-10-CM

## 2023-12-21 DIAGNOSIS — R928 Other abnormal and inconclusive findings on diagnostic imaging of breast: Secondary | ICD-10-CM

## 2023-12-23 ENCOUNTER — Ambulatory Visit
Admission: RE | Admit: 2023-12-23 | Discharge: 2023-12-23 | Disposition: A | Discharge status: Home / Self Care | Source: Ambulatory Visit | Attending: Obstetrics and Gynecology | Admitting: Obstetrics and Gynecology

## 2023-12-23 DIAGNOSIS — R921 Mammographic calcification found on diagnostic imaging of breast: Secondary | ICD-10-CM

## 2023-12-23 HISTORY — PX: BREAST BIOPSY: SHX20

## 2023-12-24 ENCOUNTER — Telehealth: Payer: Self-pay | Admitting: *Deleted

## 2023-12-24 ENCOUNTER — Encounter: Payer: Self-pay | Admitting: *Deleted

## 2023-12-24 DIAGNOSIS — D0512 Intraductal carcinoma in situ of left breast: Secondary | ICD-10-CM | POA: Insufficient documentation

## 2023-12-24 LAB — SURGICAL PATHOLOGY

## 2023-12-24 NOTE — Telephone Encounter (Signed)
 Spoke to patient to confirm upcoming morning [facility] clinic appointment on 9/17, paperwork will be sent via email.  Gave location and time, also informed patient that the surgeon's office would be calling as well to get information from them similar to the packet that they will be receiving so make sure to do both.  Reminded patient that all providers will be coming to the clinic to see them HERE and if they had any questions to not hesitate to reach back out to myself or their navigators.

## 2023-12-27 ENCOUNTER — Encounter: Payer: Self-pay | Admitting: *Deleted

## 2023-12-27 NOTE — Progress Notes (Signed)
 Radiation Oncology         [phone]) [phone] ________________________________  Name: Taylor Holden        MRN: [id number]  Date of Service: 12/29/2023 DOB: Jun 23, 1960  CC:[name], [name], MD  Taylor Berg, MD     REFERRING PHYSICIAN: Vernetta Berg, MD   DIAGNOSIS: The encounter diagnosis was Ductal carcinoma in situ (DCIS) of left breast.   HISTORY OF PRESENT ILLNESS: Taylor Holden is a 63 y.o. female seen in the multidisciplinary breast clinic for a new diagnosis of left breast cancer. The patient was noted to have left breast calcifications on a recent screening mammogram.  She returned for diagnostic workup on 12/21/2023 in the left group of calcifications were measured at 5 cm in greatest dimension in the upper inner left breast.  She underwent stereotactic biopsy on 12/23/2023 which showed intermediate grade DCIS of the anterior and posterior specimens obtained.  Necrosis and calcifications were present in the anterior specimen, and calcifications were present in the posterior specimen.  Her cancer was***.  She is seen today to discuss treatment recommendations of her disease.[name]    PREVIOUS RADIATION THERAPY: {EXAM; YES/NO:19492::No}   PAST MEDICAL HISTORY:  Past Medical History:  Diagnosis Date   Adrenal adenoma    Allergy    Aortic atherosclerosis (HCC)    Arthritis    Early menopause    Fatty liver    Glaucoma    History of kidney stones    Hyperlipidemia    Hypertension    Vitamin D  deficiency        PAST SURGICAL HISTORY: Past Surgical History:  Procedure Laterality Date   BREAST BIOPSY Left 12/23/2023   Holden LT BREAST BX W LOC DEV EA AD LESION IMG BX SPEC STEREO GUIDE 12/23/2023 [facility]   BREAST BIOPSY Left 12/23/2023   Holden LT BREAST BX W LOC DEV 1ST LESION IMAGE BX SPEC STEREO GUIDE 12/23/2023 [facility]   CATARACT EXTRACTION, BILATERAL Bilateral 07/2020   CESAREAN SECTION  04/1998   x1   CHOLECYSTECTOMY  08/1998   EYE SURGERY Right 01/2014    for glaucoma   GLAUCOMA SURGERY Bilateral 03/2020   TOTAL HIP ARTHROPLASTY Right 05/15/2022   WISDOM TOOTH EXTRACTION       FAMILY HISTORY:  Family History  Problem Relation Age of Onset   Colon polyps Mother    Alzheimer's disease Mother    Diabetes Father    Hypertension Father    Stroke Father        aneurysm    Diabetes Paternal Grandmother    Heart disease Paternal Grandmother        ~ 106 y/o   Hypertension Paternal Grandmother    Colon cancer Neg Hx    Breast cancer Neg Hx    Stomach cancer Neg Hx    Esophageal cancer Neg Hx    Pancreatic cancer Neg Hx    Rectal cancer Neg Hx      SOCIAL HISTORY:  reports that she has never smoked. She has never used smokeless tobacco. She reports that she does not drink alcohol and does not use drugs.   ALLERGIES: Cephalexin, Penicillins, Flagyl [metronidazole], Prednisone, and Ergocalciferol    MEDICATIONS:  Current Outpatient Medications  Medication Sig Dispense Refill   acetaminophen (TYLENOL) 500 MG tablet Take 1,000 mg by mouth every 8 (eight) hours as needed for mild pain or headache.     CELEBREX 200 MG capsule Take 200 mg by mouth 2 (two) times daily. As needed  Cholecalciferol (VITAMIN D3) 25 MCG (1000 UT) CAPS Take 1 capsule (1,000 Units total) by mouth daily.     latanoprost (XALATAN) 0.005 % ophthalmic solution Place 1 drop into both eyes at bedtime.     losartan  (COZAAR ) 100 MG tablet Take 1 tablet (100 mg total) by mouth daily. 90 tablet 1   metoprolol  succinate (TOPROL -XL) 50 MG 24 hr tablet Take 1 tablet (50 mg total) by mouth daily. Take with or immediately following a meal 90 tablet 1   Multiple Vitamins-Minerals (CENTRUM SILVER 50+WOMEN PO) Take by mouth.     No current facility-administered medications for this visit.     REVIEW OF SYSTEMS: On review of systems, the patient reports that she is doing ***     PHYSICAL EXAM:  Wt Readings from Last 3 Encounters:  08/25/23 212 lb 8 oz (96.4 kg)   02/17/23 209 lb 8 oz (95 kg)  08/24/22 208 lb 12.8 oz (94.7 kg)   Temp Readings from Last 3 Encounters:  08/25/23 98.2 F (36.8 C) (Oral)  02/17/23 97.9 F (36.6 C) (Oral)  04/15/22 98.4 F (36.9 C) (Oral)   BP Readings from Last 3 Encounters:  08/25/23 130/82  02/17/23 132/82  08/24/22 138/82   Pulse Readings from Last 3 Encounters:  08/25/23 73  02/17/23 82  08/24/22 82    In general this is a well appearing *** female in no acute distress. She's alert and oriented x4 and appropriate throughout the examination. Cardiopulmonary assessment is negative for acute distress and she exhibits normal effort. Bilateral breast exam is deferred.    ECOG = ***  0 - Asymptomatic (Fully active, able to carry on all predisease activities without restriction)  1 - Symptomatic but completely ambulatory (Restricted in physically strenuous activity but ambulatory and able to carry out work of a light or sedentary nature. For example, light housework, office work)  2 - Symptomatic, <50% in bed during the day (Ambulatory and capable of all self care but unable to carry out any work activities. Up and about more than 50% of waking hours)  3 - Symptomatic, >50% in bed, but not bedbound (Capable of only limited self-care, confined to bed or chair 50% or more of waking hours)  4 - Bedbound (Completely disabled. Cannot carry on any self-care. Totally confined to bed or chair)  5 - Death   Taylor Holden, Creech RH, Tormey DC, et al. [phone]). Toxicity and response criteria of the [facility]. Am. Taylor Holden. Oncol. 5 (6): 649-55    LABORATORY DATA:  Lab Results  Component Value Date   WBC 8.6 08/25/2023   HGB 14.6 08/25/2023   HCT 44.8 08/25/2023   MCV 86.3 08/25/2023   PLT 251.0 08/25/2023   Lab Results  Component Value Date   NA 138 08/25/2023   K 4.6 08/25/2023   CL 102 08/25/2023   CO2 29 08/25/2023   Lab Results  Component Value Date   ALT 41 (H) 08/25/2023    AST 25 08/25/2023   ALKPHOS 106 08/25/2023   BILITOT 0.8 08/25/2023      RADIOGRAPHY: Holden LT BREAST BX W LOC DEV 1ST LESION IMAGE BX SPEC STEREO GUIDE Addendum Date: 12/24/2023 ADDENDUM REPORT: 12/24/2023 18:04 ADDENDUM: PATHOLOGY revealed: Site 1. Breast, left, needle core biopsy, upper inner anterior, x clip- DUCTAL CARCINOMA IN SITU, SOLID, INTERMEDIATE NUCLEAR GRADE NECROSIS: PRESENT CALCIFICATIONS: PRESENT. DCIS LENGTH: 0.6 CM Pathology results are CONCORDANT with imaging findings, per Dr. Inocente Ast. PATHOLOGY revealed: Site 2.  Breast, left, needle core biopsy, upper inner posterior, coil clip- DUCTAL CARCINOMA IN SITU, SOLID, INTERMEDIATE NUCLEAR GRADE NECROSIS: NOT IDENTIFIED CALCIFICATIONS: PRESENT- DCIS LENGTH: 0.1 CM Pathology results are CONCORDANT with imaging findings, per Dr. Inocente Ast. Pathology results and recommendations below were discussed with patient by telephone on 12/24/23 by Rock Hover RN. Patient reported biopsy site within normal limits with slight tenderness at the site. Post biopsy care instructions were reviewed, questions were answered and my direct phone number was provided to patient. Patient was instructed to call [facility] if any concerns or questions arise related to the biopsy. RECOMMENDATIONS: 1. Surgical and oncological consultation. Patient was referred to the [facility] at [facility] with appointment on 12/29/2023. 2. THREE seed bracketed localization is recommended if breast conservation is desired, calcifications span approximately 5 cm x 3 cm. Pathology results reported by Rock Hover RN on 12/24/2023. Electronically Signed   By: Inocente Ast M.D.   On: 12/24/2023 18:04   Result Date: 12/24/2023 CLINICAL DATA:  63 year old female presenting for biopsy of calcifications in the left breast. EXAM: LEFT BREAST STEREOTACTIC CORE NEEDLE BIOPSY x 2 COMPARISON:  Previous  exam(s). FINDINGS: The patient and I discussed the procedure of stereotactic-guided biopsy including benefits and alternatives. We discussed the high likelihood of a successful procedure. We discussed the risks of the procedure including infection, bleeding, tissue injury, clip migration, and inadequate sampling. Informed written consent was given. The usual time out protocol was performed immediately prior to the procedure. Using sterile technique and 1% Lidocaine  as local anesthetic, under stereotactic guidance, a 9 gauge vacuum assisted device was used to perform core needle biopsy of calcifications in the upper inner anterior left breast using a superior approach. Specimen radiograph was performed showing multiple specimens with calcifications. Specimens with calcifications are identified for pathology. Lesion quadrant: Upper inner quadrant At the conclusion of the procedure, an X shaped tissue marker clip was deployed into the biopsy cavity. Follow-up 2-view mammogram was performed and dictated separately. Using sterile technique and 1% Lidocaine  as local anesthetic, under stereotactic guidance, a 9 gauge vacuum assisted device was used to perform core needle biopsy of calcifications in the upper inner posterior left breast using a superior approach. Specimen radiograph was performed showing at least 2 specimens with calcifications. Specimens with calcifications are identified for pathology. Lesion quadrant: Upper inner quadrant At the conclusion of the procedure, a coil shaped tissue marker clip was deployed into the biopsy cavity. Follow-up 2-view mammogram was performed and dictated separately. IMPRESSION: Stereotactic-guided biopsy of calcifications in the upper inner anterior and upper inner posterior left breast. No apparent complications. Electronically Signed: By: Inocente Ast M.D. On: 12/23/2023 09:54   Holden LT BREAST BX W LOC DEV EA AD LESION IMG BX SPEC STEREO GUIDE Addendum Date:  12/24/2023 ADDENDUM REPORT: 12/24/2023 18:04 ADDENDUM: PATHOLOGY revealed: Site 1. Breast, left, needle core biopsy, upper inner anterior, x clip- DUCTAL CARCINOMA IN SITU, SOLID, INTERMEDIATE NUCLEAR GRADE NECROSIS: PRESENT CALCIFICATIONS: PRESENT. DCIS LENGTH: 0.6 CM Pathology results are CONCORDANT with imaging findings, per Dr. Inocente Ast. PATHOLOGY revealed: Site 2. Breast, left, needle core biopsy, upper inner posterior, coil clip- DUCTAL CARCINOMA IN SITU, SOLID, INTERMEDIATE NUCLEAR GRADE NECROSIS: NOT IDENTIFIED CALCIFICATIONS: PRESENT- DCIS LENGTH: 0.1 CM Pathology results are CONCORDANT with imaging findings, per Dr. Inocente Ast. Pathology results and recommendations below were discussed with patient by telephone on 12/24/23 by Rock Hover RN. Patient reported biopsy site within normal limits  with slight tenderness at the site. Post biopsy care instructions were reviewed, questions were answered and my direct phone number was provided to patient. Patient was instructed to call [facility] if any concerns or questions arise related to the biopsy. RECOMMENDATIONS: 1. Surgical and oncological consultation. Patient was referred to the [facility] at [facility] with appointment on 12/29/2023. 2. THREE seed bracketed localization is recommended if breast conservation is desired, calcifications span approximately 5 cm x 3 cm. Pathology results reported by Rock Hover RN on 12/24/2023. Electronically Signed   By: Inocente Ast M.D.   On: 12/24/2023 18:04   Result Date: 12/24/2023 CLINICAL DATA:  63 year old female presenting for biopsy of calcifications in the left breast. EXAM: LEFT BREAST STEREOTACTIC CORE NEEDLE BIOPSY x 2 COMPARISON:  Previous exam(s). FINDINGS: The patient and I discussed the procedure of stereotactic-guided biopsy including benefits and alternatives. We discussed the high likelihood of a successful  procedure. We discussed the risks of the procedure including infection, bleeding, tissue injury, clip migration, and inadequate sampling. Informed written consent was given. The usual time out protocol was performed immediately prior to the procedure. Using sterile technique and 1% Lidocaine  as local anesthetic, under stereotactic guidance, a 9 gauge vacuum assisted device was used to perform core needle biopsy of calcifications in the upper inner anterior left breast using a superior approach. Specimen radiograph was performed showing multiple specimens with calcifications. Specimens with calcifications are identified for pathology. Lesion quadrant: Upper inner quadrant At the conclusion of the procedure, an X shaped tissue marker clip was deployed into the biopsy cavity. Follow-up 2-view mammogram was performed and dictated separately. Using sterile technique and 1% Lidocaine  as local anesthetic, under stereotactic guidance, a 9 gauge vacuum assisted device was used to perform core needle biopsy of calcifications in the upper inner posterior left breast using a superior approach. Specimen radiograph was performed showing at least 2 specimens with calcifications. Specimens with calcifications are identified for pathology. Lesion quadrant: Upper inner quadrant At the conclusion of the procedure, a coil shaped tissue marker clip was deployed into the biopsy cavity. Follow-up 2-view mammogram was performed and dictated separately. IMPRESSION: Stereotactic-guided biopsy of calcifications in the upper inner anterior and upper inner posterior left breast. No apparent complications. Electronically Signed: By: Inocente Ast M.D. On: 12/23/2023 09:54   Holden CLIP PLACEMENT LEFT Result Date: 12/23/2023 CLINICAL DATA:  Post procedure mammogram for clip placement. EXAM: 3D DIAGNOSTIC LEFT MAMMOGRAM POST STEREOTACTIC BIOPSY COMPARISON:  Previous exam(s). ACR Breast Density Category c: The breasts are heterogeneously dense,  which may obscure small masses. FINDINGS: 3D Mammographic images were obtained following stereotactic guided biopsy of calcifications in the upper inner anterior left breast. The X biopsy marking clip is in expected position at the site of biopsy. 3D Mammographic images were obtained following stereotactic guided biopsy of calcifications in the upper inner posterior left breast. The coil biopsy marking clip appears significantly displaced inferiorly by approximately 7.5 cm. IMPRESSION: 1. Appropriate positioning of the X biopsy marking clip in the upper inner left breast. 2. The coil biopsy marking clip appears significantly displaced inferiorly by approximately 7.5 cm. Final Assessment: Post Procedure Mammograms for Marker Placement Electronically Signed   By: Inocente Ast M.D.   On: 12/23/2023 09:53   Holden Digital Diagnostic Unilat L Result Date: 12/21/2023 CLINICAL DATA:  63 year old female presents for further evaluation of LEFT breast calcifications identified on screening mammogram. EXAM: DIGITAL DIAGNOSTIC UNILATERAL LEFT MAMMOGRAM  WITH CAD TECHNIQUE: Left digital diagnostic mammography was performed. COMPARISON:  Previous exam(s). ACR Breast Density Category c: The breasts are heterogeneously dense, which may obscure small masses. FINDINGS: Full field and magnification views of the LEFT breast demonstrate a 5 cm group of coarse heterogeneous middle depth UPPER INNER LEFT breast calcifications, some in a linear orientation. IMPRESSION: 5 cm group of indeterminate UPPER INNER LEFT breast calcifications. Two site stereotactic guided biopsy is recommended. RECOMMENDATION: Two site stereotactic guided biopsy of UPPER INNER LEFT breast calcifications, which will be scheduled. I have discussed the findings and recommendations with the patient. If applicable, a reminder letter will be sent to the patient regarding the next appointment. BI-RADS CATEGORY  4: Suspicious. Electronically Signed   By: Reyes Phi  M.D.   On: 12/21/2023 13:58       IMPRESSION/PLAN: 1. Intermediate grade ***, DCIS of the left breast. Dr. Dewey discusses the pathology findings and reviews the nature of early stage left breast disease. The consensus from the breast conference includes breast conservation with lumpectomy. Dr. Dewey recommends external radiotherapy to the breast  to reduce risks of local recurrence. Dr. Loretha anticipates adjuvant antiestrogen therapy to follow.*** We discussed the risks, benefits, short, and long term effects of radiotherapy, as well as the curative intent, and the patient is interested in proceeding. Dr. Dewey discusses the delivery and logistics of radiotherapy and anticipates a course of 4 weeks of radiotherapy to the left breast with deep inspiration breath-hold technique. We will see her back a few weeks after surgery to discuss the simulation process and anticipate we starting radiotherapy about 4-6 weeks after surgery.  2. Possible genetic predisposition to malignancy. The patient is a genetic testing discussion given her personal history. She will meet with our geneticist today in clinic to determine if she meets criteria and desires this.   In a visit lasting *** minutes, greater than 50% of the time was spent face to face reviewing her case, as well as in preparation of, discussing, and coordinating the patient's care.  The above documentation reflects my direct findings during this shared patient visit. Please see the separate note by Dr. Dewey on this date for the remainder of the patient's plan of care.    Donald KYM Husband, [facility]    **Disclaimer: This note was dictated with voice recognition software. Similar sounding words can inadvertently be transcribed and this note may contain transcription errors which may not have been corrected upon publication of note.**

## 2023-12-29 ENCOUNTER — Encounter: Payer: Self-pay | Admitting: *Deleted

## 2023-12-29 ENCOUNTER — Other Ambulatory Visit: Payer: Self-pay | Admitting: Surgery

## 2023-12-29 ENCOUNTER — Ambulatory Visit
Admission: RE | Admit: 2023-12-29 | Discharge: 2023-12-29 | Disposition: A | Discharge status: Home / Self Care | Source: Ambulatory Visit | Attending: Radiation Oncology | Admitting: Radiation Oncology

## 2023-12-29 ENCOUNTER — Ambulatory Visit: Admitting: Physical Therapy

## 2023-12-29 ENCOUNTER — Ambulatory Visit (HOSPITAL_BASED_OUTPATIENT_CLINIC_OR_DEPARTMENT_OTHER)

## 2023-12-29 ENCOUNTER — Inpatient Hospital Stay: Admitting: Licensed Clinical Social Worker

## 2023-12-29 ENCOUNTER — Inpatient Hospital Stay (HOSPITAL_BASED_OUTPATIENT_CLINIC_OR_DEPARTMENT_OTHER): Admitting: Hematology and Oncology

## 2023-12-29 ENCOUNTER — Inpatient Hospital Stay: Attending: Hematology and Oncology

## 2023-12-29 VITALS — BP 149/55 | HR 80 | Temp 97.7°F | Resp 16 | Ht 64.02 in | Wt 209.7 lb

## 2023-12-29 DIAGNOSIS — Z853 Personal history of malignant neoplasm of breast: Secondary | ICD-10-CM | POA: Diagnosis not present

## 2023-12-29 DIAGNOSIS — D0512 Intraductal carcinoma in situ of left breast: Secondary | ICD-10-CM

## 2023-12-29 DIAGNOSIS — M8580 Other specified disorders of bone density and structure, unspecified site: Secondary | ICD-10-CM | POA: Insufficient documentation

## 2023-12-29 DIAGNOSIS — Z800 Family history of malignant neoplasm of digestive organs: Secondary | ICD-10-CM | POA: Diagnosis not present

## 2023-12-29 DIAGNOSIS — Z79899 Other long term (current) drug therapy: Secondary | ICD-10-CM | POA: Insufficient documentation

## 2023-12-29 LAB — CBC WITH DIFFERENTIAL (CANCER CENTER ONLY)
Abs Immature Granulocytes: 0.02 K/uL (ref 0.00–0.07)
Basophils Absolute: 0.1 K/uL (ref 0.0–0.1)
Basophils Relative: 1 %
Eosinophils Absolute: 0.1 K/uL (ref 0.0–0.5)
Eosinophils Relative: 1 %
HCT: 43 % (ref 36.0–46.0)
Hemoglobin: 14.3 g/dL (ref 12.0–15.0)
Immature Granulocytes: 0 %
Lymphocytes Relative: 24 %
Lymphs Abs: 1.8 K/uL (ref 0.7–4.0)
MCH: 28.3 pg (ref 26.0–34.0)
MCHC: 33.3 g/dL (ref 30.0–36.0)
MCV: 85 fL (ref 80.0–100.0)
Monocytes Absolute: 0.6 K/uL (ref 0.1–1.0)
Monocytes Relative: 8 %
Neutro Abs: 5 K/uL (ref 1.7–7.7)
Neutrophils Relative %: 66 %
Platelet Count: 255 K/uL (ref 150–400)
RBC: 5.06 MIL/uL (ref 3.87–5.11)
RDW: 13.2 % (ref 11.5–15.5)
WBC Count: 7.5 K/uL (ref 4.0–10.5)
nRBC: 0 % (ref 0.0–0.2)

## 2023-12-29 LAB — CMP (CANCER CENTER ONLY)
ALT: 27 U/L (ref 0–44)
AST: 22 U/L (ref 15–41)
Albumin: 4.3 g/dL (ref 3.5–5.0)
Alkaline Phosphatase: 100 U/L (ref 38–126)
Anion gap: 6 (ref 5–15)
BUN: 11 mg/dL (ref 8–23)
CO2: 29 mmol/L (ref 22–32)
Calcium: 9.4 mg/dL (ref 8.9–10.3)
Chloride: 105 mmol/L (ref 98–111)
Creatinine: 0.86 mg/dL (ref 0.44–1.00)
GFR, Estimated: >60 mL/min (ref >60)
Glucose, Bld: 107 mg/dL — ABNORMAL HIGH (ref 70–99)
Potassium: 4.1 mmol/L (ref 3.5–5.1)
Sodium: 140 mmol/L (ref 135–145)
Total Bilirubin: 0.7 mg/dL (ref 0.0–1.2)
Total Protein: 7.4 g/dL (ref 6.5–8.1)

## 2023-12-29 NOTE — Progress Notes (Signed)
 [facility] Clinical Social Work  Initial Assessment   Taylor Holden is a 63 y.o. year old female accompanied by husband, Taylor Holden. Clinical Social Work was referred by [facility] for assessment of psychosocial needs.   SDOH (Social Determinants of Health) assessments performed: Yes SDOH Interventions    Flowsheet Row Clinical Support from 12/29/2023 in [facility] - A Dept Of [place]. [facility]  SDOH Interventions   Food Insecurity Interventions Intervention Not Indicated  Housing Interventions Intervention Not Indicated  Transportation Interventions Intervention Not Indicated  Utilities Interventions Intervention Not Indicated    SDOH Screenings   Food Insecurity: No Food Insecurity (12/29/2023)  Housing: Low Risk  (12/29/2023)  Transportation Needs: No Transportation Needs (12/29/2023)  Utilities: Not At Risk (12/29/2023)  Depression (PHQ2-9): Low Risk  (12/29/2023)  Tobacco Use: Low Risk  (12/29/2023)   Received from [facility]    PHQ 2/9:    12/29/2023   11:44 AM 08/25/2023    9:50 AM 02/17/2023   11:01 AM  Depression screen PHQ 2/9  Decreased Interest 0 0 0  Down, Depressed, Hopeless 0 0 0  PHQ - 2 Score 0 0 0     Distress Screen completed: No     No data to display            Family/Social Information:  Housing Arrangement: patient lives with her husband and their son (25yo) Family members/support persons in your life? 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  Income source: pension, retirement Geographical information systems officer concerns: No Type of concern: None Food access concerns: no Religious or spiritual practice: Yes-cites her spirituality for helping cope Services Currently in place:  [employer]  Coping/ Adjustment to diagnosis: Patient understands treatment plan and what happens next? yes Patient reported stressors: her dog died recently Patient enjoys  cooking, crochet, shopping, being outside, animals, hockey, time with family Current coping skills/ strengths: Ability for insight , Capable of independent living , Communication skills , Special hobby/interest , and Supportive family/friends     SUMMARY: Current SDOH Barriers:  No major barriers identified today  Clinical Social Work Clinical Goal(s):  No clinical social work goals at this time  Interventions: Discussed common feeling and emotions when being diagnosed with cancer, and the importance of support during treatment Informed patient of the support team roles and support services at [facility] Provided CSW contact information and encouraged patient to call with any questions or concerns   Follow Up Plan: Patient will contact CSW with any support or resource needs Patient verbalizes understanding of plan: Yes    Bohdan Macho E Khadijatou Borak, LCSW Clinical Social Worker [facility]

## 2023-12-29 NOTE — Progress Notes (Signed)
 [facility] CONSULT NOTE  Patient Care Team: Amon [name], MD as PCP - General (Internal Medicine) Leslee Reusing, MD as Consulting Physician (Ophthalmology) Marget Lenis, MD as Consulting Physician (Obstetrics and Gynecology) Mansouraty, Aloha Raddle., MD as Consulting Physician (Gastroenterology) Tyree Nanetta SAILOR, RN as Oncology Nurse Navigator Gerome, Devere HERO, RN as Oncology Nurse Navigator Vernetta Berg, MD as Consulting Physician (General Surgery) Loretha Ash, MD as Consulting Physician (Hematology and Oncology) Dewey Rush, MD as Consulting Physician (Radiation Oncology)  CHIEF COMPLAINTS/PURPOSE OF CONSULTATION:  DCIS  ASSESSMENT & PLAN:   Assessment and Plan Assessment & Plan 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 - Bracketed lumpectomy by Dr. Vernetta. - Radiation therapy post-surgery. - We have discussed options for antiestrogen therapy today. With regards to Tamoxifen, we discussed that this is a SERM, selective estrogen receptor modulator. We discussed mechanism of action of Tamoxifen, adverse effects on Tamoxifen including but not limited to post menopausal symptoms, increased risk of DVT/PE, increased risk of endometrial cancer, questionable cataracts with long term use and increased risk of cardiovascular events in the study which was not statistically significant. A benefit from Tamoxifen would be improvement in bone density. With regards to aromatase inhibitors, we discussed mechanism of action, adverse effects including but not limited to post menopausal symptoms, arthralgias, myalgias, increased risk of cardiovascular events and bone loss.  She is leaning towards aromatase inhibitors.  Osteopenia Mild osteopenia with T-score of -1.2. Aromatase inhibitors deemed appropriate despite  potential bone density loss. - Continue calcium and vitamin D  supplementation. - Engage in weight-bearing exercises such as walking and weight lifting.    HISTORY OF PRESENTING ILLNESS:  Taylor Holden 63 y.o. female is here because of DCIS  Discussed the use of AI scribe software for clinical note transcription with the patient, who gave verbal consent to proceed.  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 She was referred to [facility] for evaluation and treatment of breast cancer.  She has been diagnosed with ductal carcinoma in situ (DCIS) of the left breast, located in the upper inner quadrant. The cancer spans over five centimeters but remains confined to the milk ducts. It is estrogen and progesterone receptor positive, with 100% and 90% of the tumor expressing affinity for estrogen and progesterone, respectively.  She has mild osteopenia, with a bone density score of -1.2. She is currently taking calcium chews and vitamin D  supplements.  No history of blood clots or underlying heart issues. She has not experienced any changes in the breast prior to the mammogram.  All other systems were reviewed with the patient and are negative.  MEDICAL HISTORY:  Past Medical History:  Diagnosis Date   Adrenal adenoma    Allergy    Aortic atherosclerosis (HCC)    Arthritis    Early menopause    Fatty liver    Glaucoma    History of kidney stones    Hyperlipidemia    Hypertension    Vitamin D  deficiency     SURGICAL HISTORY: Past Surgical History:  Procedure Laterality Date   BREAST BIOPSY Left 12/23/2023   MM LT BREAST BX W LOC DEV EA AD LESION IMG BX SPEC STEREO GUIDE 12/23/2023 [facility]  BREAST BIOPSY Left 12/23/2023   MM LT BREAST BX W LOC DEV 1ST LESION IMAGE BX SPEC STEREO GUIDE 12/23/2023 [facility]   CATARACT EXTRACTION,  BILATERAL Bilateral 07/2020   CESAREAN SECTION  04/1998   x1   CHOLECYSTECTOMY  08/1998   EYE SURGERY Right 01/2014   for glaucoma   GLAUCOMA SURGERY Bilateral 03/2020   TOTAL HIP ARTHROPLASTY Right 05/15/2022   WISDOM TOOTH EXTRACTION      SOCIAL HISTORY: Social History   Socioeconomic History   Marital status: Married    Spouse name: Not on file   Number of children: 1   Years of education: Not on file   Highest education level: Not on file  Occupational History   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, site , retiring 2019  Tobacco Use   Smoking status: Never   Smokeless tobacco: Never  Vaping Use   Vaping status: Never Used  Substance and Sexual Activity   Alcohol use: Never    Alcohol/week: 0.0 standard drinks of alcohol   Drug use: No   Sexual activity: Not Currently    Partners: Male    Birth control/protection: Post-menopausal  Other Topics Concern   Not on file  Social History Narrative   Household: pt, husband , son   Son 2000, [employer]: graduated;  dx w/ Crohn's Dz 04-2019   Social Drivers of Corporate investment banker Strain: Not on file  Food Insecurity: Not on file  Transportation Needs: Not on file  Physical Activity: Not on file  Stress: Not on file  Social Connections: Not on file  Intimate Partner Violence: Not on file    FAMILY HISTORY: Family History  Problem Relation Age of Onset   Colon polyps Mother    Alzheimer's disease Mother    Diabetes Father    Hypertension Father    Stroke Father        aneurysm    Diabetes Paternal Grandmother    Heart disease Paternal Grandmother        ~ 49 y/o   Hypertension Paternal Grandmother    Colon cancer Neg Hx    Breast cancer Neg Hx    Stomach cancer Neg Hx    Esophageal cancer Neg Hx    Pancreatic cancer Neg Hx    Rectal cancer Neg Hx     ALLERGIES:  is allergic to cephalexin, penicillins, flagyl [metronidazole], prednisone, and ergocalciferol .  MEDICATIONS:  Current  Outpatient Medications  Medication Sig Dispense Refill   acetaminophen (TYLENOL) 500 MG tablet Take 1,000 mg by mouth every 8 (eight) hours as needed for mild pain or headache.     CELEBREX 200 MG capsule Take 200 mg by mouth 2 (two) times daily. As needed     Cholecalciferol (VITAMIN D3) 25 MCG (1000 UT) CAPS Take 1 capsule (1,000 Units total) by mouth daily.     latanoprost (XALATAN) 0.005 % ophthalmic solution Place 1 drop into both eyes at bedtime.     losartan  (COZAAR ) 100 MG tablet Take 1 tablet (100 mg total) by mouth daily. 90 tablet 1   metoprolol  succinate (TOPROL -XL) 50 MG 24 hr tablet Take 1 tablet (50 mg total) by mouth daily. Take with or immediately following a meal 90 tablet 1   Multiple Vitamins-Minerals (CENTRUM SILVER 50+WOMEN PO) Take by mouth.     No current facility-administered medications for this visit.     PHYSICAL EXAMINATION: ECOG PERFORMANCE STATUS: 0 - Asymptomatic  Vitals:   12/29/23 0858  BP: (!) 149/55  Pulse: 80  Resp: 16  Temp: 97.7 F (36.5 C)  SpO2: 96%   Filed Weights   12/29/23 0858  Weight: 209 lb 11.2 oz (95.1 kg)    GENERAL:alert, no distress and comfortable No cervical adenopathy No palpable breast masses. No regional adenopathy No LE edema.  LABORATORY DATA:  I have reviewed the data as listed Lab Results  Component Value Date   WBC 8.6 08/25/2023   HGB 14.6 08/25/2023   HCT 44.8 08/25/2023   MCV 86.3 08/25/2023   PLT 251.0 08/25/2023     Chemistry      Component Value Date/Time   NA 138 08/25/2023 1047   K 4.6 08/25/2023 1047   CL 102 08/25/2023 1047   CO2 29 08/25/2023 1047   BUN 12 08/25/2023 1047   CREATININE 0.95 08/25/2023 1047   CREATININE 0.99 05/15/2016 1650      Component Value Date/Time   CALCIUM 9.3 08/25/2023 1047   ALKPHOS 106 08/25/2023 1047   AST 25 08/25/2023 1047   ALT 41 (H) 08/25/2023 1047   BILITOT 0.8 08/25/2023 1047       RADIOGRAPHIC STUDIES: I have personally reviewed the  radiological images as listed and agreed with the findings in the report. MM LT BREAST BX W LOC DEV 1ST LESION IMAGE BX SPEC STEREO GUIDE Addendum Date: 12/24/2023 ADDENDUM REPORT: 12/24/2023 18:04 ADDENDUM: PATHOLOGY revealed: Site 1. Breast, left, needle core biopsy, upper inner anterior, x clip- DUCTAL CARCINOMA IN SITU, SOLID, INTERMEDIATE NUCLEAR GRADE NECROSIS: PRESENT CALCIFICATIONS: PRESENT. DCIS LENGTH: 0.6 CM Pathology results are CONCORDANT with imaging findings, per Dr. Inocente Ast. PATHOLOGY revealed: Site 2. Breast, left, needle core biopsy, upper inner posterior, coil clip- DUCTAL CARCINOMA IN SITU, SOLID, INTERMEDIATE NUCLEAR GRADE NECROSIS: NOT IDENTIFIED CALCIFICATIONS: PRESENT- DCIS LENGTH: 0.1 CM Pathology results are CONCORDANT with imaging findings, per Dr. Inocente Ast. Pathology results and recommendations below were discussed with patient by telephone on 12/24/23 by Rock Hover RN. Patient reported biopsy site within normal limits with slight tenderness at the site. Post biopsy care instructions were reviewed, questions were answered and my direct phone number was provided to patient. Patient was instructed to call [facility] if any concerns or questions arise related to the biopsy. RECOMMENDATIONS: 1. Surgical and oncological consultation. Patient was referred to [facility] with appointment on 12/29/2023. 2. THREE seed bracketed localization is recommended if breast conservation is desired, calcifications span approximately 5 cm x 3 cm. Pathology results reported by Rock Hover RN on 12/24/2023. Electronically Signed   By: Inocente Ast M.D.   On: 12/24/2023 18:04   Result Date: 12/24/2023 CLINICAL DATA:  63 year old female presenting for biopsy of calcifications in the left breast. EXAM: LEFT BREAST STEREOTACTIC CORE NEEDLE BIOPSY x 2 COMPARISON:  Previous exam(s). FINDINGS:  The patient and I discussed the procedure of stereotactic-guided biopsy including benefits and alternatives. We discussed the high likelihood of a successful procedure. We discussed the risks of the procedure including infection, bleeding, tissue injury, clip migration, and inadequate sampling. Informed written consent was given. The usual time out protocol was performed immediately prior to the procedure. Using sterile technique and 1% Lidocaine  as local anesthetic, under stereotactic guidance, a 9 gauge vacuum assisted device was used to perform core needle biopsy of calcifications in the upper inner anterior left breast using a superior approach. Specimen radiograph was performed showing multiple specimens with calcifications. Specimens with calcifications are identified for pathology.  Lesion quadrant: Upper inner quadrant At the conclusion of the procedure, an X shaped tissue marker clip was deployed into the biopsy cavity. Follow-up 2-view mammogram was performed and dictated separately. Using sterile technique and 1% Lidocaine  as local anesthetic, under stereotactic guidance, a 9 gauge vacuum assisted device was used to perform core needle biopsy of calcifications in the upper inner posterior left breast using a superior approach. Specimen radiograph was performed showing at least 2 specimens with calcifications. Specimens with calcifications are identified for pathology. Lesion quadrant: Upper inner quadrant At the conclusion of the procedure, a coil shaped tissue marker clip was deployed into the biopsy cavity. Follow-up 2-view mammogram was performed and dictated separately. IMPRESSION: Stereotactic-guided biopsy of calcifications in the upper inner anterior and upper inner posterior left breast. No apparent complications. Electronically Signed: By: Inocente Ast M.D. On: 12/23/2023 09:54   MM LT BREAST BX W LOC DEV EA AD LESION IMG BX SPEC STEREO GUIDE Addendum Date: 12/24/2023 ADDENDUM REPORT:  12/24/2023 18:04 ADDENDUM: PATHOLOGY revealed: Site 1. Breast, left, needle core biopsy, upper inner anterior, x clip- DUCTAL CARCINOMA IN SITU, SOLID, INTERMEDIATE NUCLEAR GRADE NECROSIS: PRESENT CALCIFICATIONS: PRESENT. DCIS LENGTH: 0.6 CM Pathology results are CONCORDANT with imaging findings, per Dr. Inocente Ast. PATHOLOGY revealed: Site 2. Breast, left, needle core biopsy, upper inner posterior, coil clip- DUCTAL CARCINOMA IN SITU, SOLID, INTERMEDIATE NUCLEAR GRADE NECROSIS: NOT IDENTIFIED CALCIFICATIONS: PRESENT- DCIS LENGTH: 0.1 CM Pathology results are CONCORDANT with imaging findings, per Dr. Inocente Ast. Pathology results and recommendations below were discussed with patient by telephone on 12/24/23 by Rock Hover RN. Patient reported biopsy site within normal limits with slight tenderness at the site. Post biopsy care instructions were reviewed, questions were answered and my direct phone number was provided to patient. Patient was instructed to call [facility] if any concerns or questions arise related to the biopsy. RECOMMENDATIONS: 1. Surgical and oncological consultation. Patient was referred to the [facility] with appointment on 12/29/2023. 2. THREE seed bracketed localization is recommended if breast conservation is desired, calcifications span approximately 5 cm x 3 cm. Pathology results reported by Rock Hover RN on 12/24/2023. Electronically Signed   By: Inocente Ast M.D.   On: 12/24/2023 18:04   Result Date: 12/24/2023 CLINICAL DATA:  63 year old female presenting for biopsy of calcifications in the left breast. EXAM: LEFT BREAST STEREOTACTIC CORE NEEDLE BIOPSY x 2 COMPARISON:  Previous exam(s). FINDINGS: The patient and I discussed the procedure of stereotactic-guided biopsy including benefits and alternatives. We discussed the high likelihood of a successful procedure. We discussed the  risks of the procedure including infection, bleeding, tissue injury, clip migration, and inadequate sampling. Informed written consent was given. The usual time out protocol was performed immediately prior to the procedure. Using sterile technique and 1% Lidocaine  as local anesthetic, under stereotactic guidance, a 9 gauge vacuum assisted device was used to perform core needle biopsy of calcifications in the upper inner anterior left breast using a superior approach. Specimen radiograph was performed showing multiple specimens with calcifications. Specimens with calcifications are identified for pathology. Lesion quadrant: Upper inner quadrant At the conclusion of the procedure, an X shaped tissue marker clip was deployed into the biopsy cavity. Follow-up 2-view mammogram was performed and dictated separately. Using sterile technique and 1% Lidocaine  as local anesthetic, under stereotactic guidance, a 9 gauge vacuum assisted device was used to perform core needle biopsy of calcifications in the upper inner  posterior left breast using a superior approach. Specimen radiograph was performed showing at least 2 specimens with calcifications. Specimens with calcifications are identified for pathology. Lesion quadrant: Upper inner quadrant At the conclusion of the procedure, a coil shaped tissue marker clip was deployed into the biopsy cavity. Follow-up 2-view mammogram was performed and dictated separately. IMPRESSION: Stereotactic-guided biopsy of calcifications in the upper inner anterior and upper inner posterior left breast. No apparent complications. Electronically Signed: By: Inocente Ast M.D. On: 12/23/2023 09:54   MM CLIP PLACEMENT LEFT Result Date: 12/23/2023 CLINICAL DATA:  Post procedure mammogram for clip placement. EXAM: 3D DIAGNOSTIC LEFT MAMMOGRAM POST STEREOTACTIC BIOPSY COMPARISON:  Previous exam(s). ACR Breast Density Category c: The breasts are heterogeneously dense, which may obscure small  masses. FINDINGS: 3D Mammographic images were obtained following stereotactic guided biopsy of calcifications in the upper inner anterior left breast. The X biopsy marking clip is in expected position at the site of biopsy. 3D Mammographic images were obtained following stereotactic guided biopsy of calcifications in the upper inner posterior left breast. The coil biopsy marking clip appears significantly displaced inferiorly by approximately 7.5 cm. IMPRESSION: 1. Appropriate positioning of the X biopsy marking clip in the upper inner left breast. 2. The coil biopsy marking clip appears significantly displaced inferiorly by approximately 7.5 cm. Final Assessment: Post Procedure Mammograms for Marker Placement Electronically Signed   By: Inocente Ast M.D.   On: 12/23/2023 09:53   MM Digital Diagnostic Unilat L Result Date: 12/21/2023 CLINICAL DATA:  63 year old female presents for further evaluation of LEFT breast calcifications identified on screening mammogram. EXAM: DIGITAL DIAGNOSTIC UNILATERAL LEFT MAMMOGRAM WITH CAD TECHNIQUE: Left digital diagnostic mammography was performed. COMPARISON:  Previous exam(s). ACR Breast Density Category c: The breasts are heterogeneously dense, which may obscure small masses. FINDINGS: Full field and magnification views of the LEFT breast demonstrate a 5 cm group of coarse heterogeneous middle depth UPPER INNER LEFT breast calcifications, some in a linear orientation. IMPRESSION: 5 cm group of indeterminate UPPER INNER LEFT breast calcifications. Two site stereotactic guided biopsy is recommended. RECOMMENDATION: Two site stereotactic guided biopsy of UPPER INNER LEFT breast calcifications, which will be scheduled. I have discussed the findings and recommendations with the patient. If applicable, a reminder letter will be sent to the patient regarding the next appointment. BI-RADS CATEGORY  4: Suspicious. Electronically Signed   By: Reyes Phi M.D.   On: 12/21/2023 13:58     All questions were answered. The patient knows to call the clinic with any problems, questions or concerns. I spent 45 minutes in the care of this patient including H and P, review of records, counseling and coordination of care.     Amber Stalls, MD 12/29/2023 10:54 AM

## 2023-12-29 NOTE — Progress Notes (Unsigned)
 REFERRING PROVIDER: Amon [name], MD [address] [place],  [place] [address]  PRIMARY PROVIDER:  Amon [name], MD  PRIMARY REASON FOR VISIT:  Breast Multidisciplinary Clinic  HISTORY OF PRESENT ILLNESS:   Ms. Babiarz, a 63 y.o. female, was seen for a [facility] cancer genetics consultation at the request of her primary care provider, Dr. Vernetta due to a personal history of breast cancer.  Ms. Laduke presents today the at the Breast Multidisciplinary Clinic to discuss the possibility of a hereditary predisposition to cancer, genetic testing, and to further clarify her future cancer risks, as well as potential cancer risks for family members.   In September 2025, at the age of 38, Ms. 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 The cancer spans over five centimeters but remains confined to the milk ducts. It is estrogen and progesterone receptor positive, with 100% and 90% of the tumor expressing affinity for estrogen and progesterone, respectively.   CANCER HISTORY:  Oncology History   No history exists.   RISK FACTORS:  Menarche was at age 39.  First live birth at age 15.  OCP use for approximately 4 years.  Ovaries intact: yes.  Hysterectomy: no.  Menopausal status: postmenopausal.  Colonoscopy: yes; 2-5 polyps. Mammogram within the last year: yes. Number of breast biopsies: 1.  Past Medical History:  Diagnosis Date   Adrenal adenoma    Allergy    Aortic atherosclerosis (HCC)    Arthritis    Early menopause    Fatty liver    Glaucoma    History of kidney stones    Hyperlipidemia    Hypertension    Vitamin D  deficiency    Past Surgical History:  Procedure Laterality Date   BREAST BIOPSY Left 12/23/2023   MM LT BREAST BX W LOC DEV EA AD LESION IMG BX SPEC STEREO GUIDE 12/23/2023 [facility]   BREAST BIOPSY Left 12/23/2023   MM LT BREAST BX W LOC DEV 1ST LESION IMAGE BX SPEC STEREO  GUIDE 12/23/2023 [facility]   CATARACT EXTRACTION, BILATERAL Bilateral 07/2020   CESAREAN SECTION  04/1998   x1   CHOLECYSTECTOMY  08/1998   EYE SURGERY Right 01/2014   for glaucoma   GLAUCOMA SURGERY Bilateral 03/2020   TOTAL HIP ARTHROPLASTY Right 05/15/2022   WISDOM TOOTH EXTRACTION     Social History   Socioeconomic History   Marital status: Married    Spouse name: Not on file   Number of children: 1   Years of education: Not on file   Highest education level: Not on file  Occupational History   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, site , retiring 2019  Tobacco Use   Smoking status: Never   Smokeless tobacco: Never  Vaping Use   Vaping status: Never Used  Substance and Sexual Activity   Alcohol use: Never    Alcohol/week: 0.0 standard drinks of alcohol   Drug use: No   Sexual activity: Not Currently    Partners: Male    Birth control/protection: Post-menopausal  Other Topics Concern   Not on file  Social History Narrative   Household: pt, husband , son   Son 2000, [employer]: graduated;  dx w/ Crohn's Dz 04-2019   Social Drivers of Corporate investment banker Strain: Not on file  Food Insecurity: No Food Insecurity (12/29/2023)   Hunger Vital Sign    Worried About Radiation protection practitioner of Food  in the Last Year: Never true    Ran Out of Food in the Last Year: Never true  Transportation Needs: No Transportation Needs (12/29/2023)   PRAPARE - Administrator, Civil Service (Medical): No    Lack of Transportation (Non-Medical): No  Physical Activity: Not on file  Stress: Not on file  Social Connections: Not on file     FAMILY HISTORY:  We obtained a detailed, 4-generation family history.  Significant diagnoses are listed below: Family History  Problem Relation Age of Onset   Colon polyps Mother    Alzheimer's disease Mother    Diabetes Father    Hypertension Father    Stroke Father        aneurysm    Diabetes Paternal Grandmother    Heart  disease Paternal Grandmother        ~ 68 y/o   Hypertension Paternal Grandmother    Melanoma Maternal Grandmother 28   Colon cancer Other    Leukemia Paternal Cousin 10    Ms. Segovia is not aware of previous family history of genetic testing for hereditary cancer risks. She is not aware of any Ashkenazi Jewish ancestry.   GENETIC COUNSELING ASSESSMENT: Ms. Brodowski is a 63 y.o. female with a personal and family history of melanoma and colon cancer in maternal grandmother and maternal great aunt.   DISCUSSION: We discussed that, in general, most cancer is not inherited in families, but instead is sporadic or familial. Sporadic cancers occur by chance and typically happen at older ages (>50 years) as this type of cancer is caused by genetic changes acquired during an individual's lifetime. Some families have more cancers than would be expected by chance; however, the ages or types of cancer are not consistent with a known genetic mutation or known genetic mutations have been ruled out. This type of familial cancer is thought to be due to a combination of multiple genetic, environmental, hormonal, and lifestyle factors. While this combination of factors likely increases the risk of cancer, the exact source of this risk is not currently identifiable or testable.  We discussed that 5 - 10% of cancer is hereditary, with most cases associated with a hereditary cancer syndrome. There are other genes that can be associated with hereditary cancer syndromes. We discussed that testing is beneficial for several reasons including knowing how to follow individuals after completing their treatment, identifying whether potential treatment options such as PARP inhibitors would be beneficial, and understand if other family members could be at risk for cancer and allow them to undergo genetic testing.   We reviewed the characteristics, features and inheritance patterns of hereditary cancer syndromes. We also discussed  genetic testing, including the appropriate family members to test, the process of testing, insurance coverage and turn-around-time for results. We discussed the implications of a negative, positive, carrier and/or variant of uncertain significant result. Ms. Sugarman  was offered a common hereditary cancer panel (36+ genes) and an expanded pan-cancer panel (70+ genes). Ms. Honeyman was informed of the benefits and limitations of each panel, including that expanded pan-cancer panels contain genes that do not have clear management guidelines at this point in time.  We also discussed that as the number of genes included on a panel increases, the chances of variants of uncertain significance increases.  We discussed that some people do not want to undergo genetic testing due to fear of genetic discrimination.  The Genetic Information Nondiscrimination Act (GINA) was signed into federal law in 2008. GINA  prohibits health insurers and most employers from discriminating against individuals based on genetic information (including the results of genetic tests and family history information). According to GINA, health insurance companies cannot consider genetic information to be a preexisting condition, nor can they use it to make decisions regarding coverage or rates. GINA also makes it illegal for most employers to use genetic information in making decisions about hiring, firing, promotion, or terms of employment. It is important to note that GINA does not offer protections for life insurance, disability insurance, or long-term care insurance. GINA does not apply to those in the [employer], those who work for companies with less than 15 employees, and new life insurance or long-term disability insurance policies.  Health status due to a cancer diagnosis is not protected under GINA. More information about GINA can be found by visiting [employer].  We reviewed the characteristics, features and inheritance patterns of  hereditary cancer syndromes. We also discussed genetic testing, including the appropriate family members to test, the process of testing, insurance coverage and turn-around-time for results. We discussed the implications of a negative, positive and/or variant of uncertain significant result.   We discussed with Ms. Kirker that the personal and family history does not meet insurance or NCCN criteria for genetic testing and, therefore, is not highly consistent with a familial hereditary cancer syndrome.  We feel she is at low risk to harbor  a gene mutation associated with such a condition.   Genetic Testing Consent: Ms. Huertas decided NOT to pursue genetic testing at this time and wished to take time to consider if she would like to have testing. Ms. Heyboer was encouraged to reach out if she becomes interested in having genetic testing ordered. She was provided with the clinic phone number. Should she wish to pursue genetic testing an order for blood work will be placed at that time.   Ms. Gutzwiller questions were answered to her satisfaction today. Our contact information was provided should additional questions or concerns arise. Thank you for the referral and allowing us  to share in the care of your patient.   Santana Fryer, MS, CGC  Certified Genetic Counselor  Email: [email or web address]  Phone: [phone]  In total, 15 minutes were spent on the date of the encounter in service to the patient including preparation, face-to-face consultation, documentation and care coordination. The patient brought her husband to the visit. The patient brought her daughter. Drs. Lanny Stalls, and/or Gudena were available for questions, if needed. _______________________________________________________________________ For Office Staff:  Number of people involved in session: 2 Was an Intern/ student involved with case: no

## 2023-12-30 ENCOUNTER — Other Ambulatory Visit: Payer: Self-pay | Admitting: Surgery

## 2023-12-30 DIAGNOSIS — D0512 Intraductal carcinoma in situ of left breast: Secondary | ICD-10-CM

## 2024-01-03 NOTE — Progress Notes (Signed)
 Subjective Patient ID: Taylor Holden is a 63 y.o. female.   HPI  Patient of Drs. Vernetta and Iruku here for consultation breast reconstruction. Patient known to me as I have cared for her son. Presented following screening mammogram with left breast calcifications. Diagnostic MMG showed 5 cm group of UPPER INNER LEFT breast calcifications. Biopsies labeled left upper inner anterior and left upper inner posterior both demonstrated intermediate grade DCIS, ER/PR+.  Plan lumpectomy, scheduled for 10.1.2025. She has discussed oncoplastic reconstruction with Dr. Vernetta.  Patient declined genetic testing.  Current 44 DD.  Wt stable  Retired Runner, broadcasting/film/video. Lives with spouse that accompanies her.  Review of Systems  Musculoskeletal:  Positive for arthralgias and joint swelling.  All other systems reviewed and are negative.   Objective Physical Exam  Cardiovascular: Normal rate.   Pulmonary/Chest Effort normal.   Skin  Fitzpatrick 2   Psychiatric She has a normal mood and affect.     Lymph: no palpable axillary adenopathy  Breasts: no palpable masses, grade 3 ptosis bilateral Right>left volume SN to nipple R 31.5 L 32.5 cm BW R 28 L 27 cm Nipple to IMF R 17 L 14 cm  Assessment/Plan Diagnoses and all orders for this visit:  Breast neoplasm, Tis (DCIS), left  Discussed oncoplastic reconstruction 7-10 d post lumpectomy to ensure pathologic clearance. Reviewed reduction with anchor type scars, drains, post operative visits and limitations, recovery. Diminished sensation nipple and breast skin, risk of nipple loss, wound healing problems, asymmetry. Discussed will have some contraction of breast volume and increased firmness with radiation, less ptosis with aging. This can result in asymmetries long term. Discussed changes with wt gain, loss, aging. Discussed lumpectomy alone can result in NAC displacement, distortion contour breast following lumpectomy and RT, asymmetry breast  volume and NAC position. Reviewed purpose of this type reconstruction to prevent these. Reviewed breast lift or trying to correct NAC displacement post RT more difficult. Counseled I cannot assure her cup size.  Reviewed can defer surgery until after therapies complete. In this setting would wait at least 6 months from end RT for any surgery. Reviewed increased risks complications in setting RT. Reviewed any complications from oncoplastic reconstruction procedure may delay start adjuvant therapies.    Pictures taken. Patient desires to porcieed.

## 2024-01-04 ENCOUNTER — Telehealth: Payer: Self-pay | Admitting: *Deleted

## 2024-01-04 ENCOUNTER — Encounter: Payer: Self-pay | Admitting: *Deleted

## 2024-01-04 DIAGNOSIS — D0512 Intraductal carcinoma in situ of left breast: Secondary | ICD-10-CM

## 2024-01-04 NOTE — Telephone Encounter (Signed)
 Called and spoke to  patient to follow up on [facility] clinic last week 10/17. She stated she was currently at Dr. Wyline office for a plastic surgery consult. Stated she had no questions at this time but aware of navigators' numbers if a need arises.

## 2024-01-05 ENCOUNTER — Encounter (HOSPITAL_BASED_OUTPATIENT_CLINIC_OR_DEPARTMENT_OTHER): Payer: Self-pay | Admitting: Surgery

## 2024-01-05 ENCOUNTER — Other Ambulatory Visit: Payer: Self-pay

## 2024-01-06 ENCOUNTER — Encounter (HOSPITAL_BASED_OUTPATIENT_CLINIC_OR_DEPARTMENT_OTHER)
Admission: RE | Admit: 2024-01-06 | Discharge: 2024-01-06 | Disposition: A | Discharge status: Home / Self Care | Source: Ambulatory Visit | Attending: Surgery

## 2024-01-06 ENCOUNTER — Other Ambulatory Visit: Payer: Self-pay

## 2024-01-06 DIAGNOSIS — Z170 Estrogen receptor positive status [ER+]: Secondary | ICD-10-CM | POA: Insufficient documentation

## 2024-01-06 DIAGNOSIS — D0512 Intraductal carcinoma in situ of left breast: Secondary | ICD-10-CM | POA: Diagnosis not present

## 2024-01-06 DIAGNOSIS — Z01818 Encounter for other preprocedural examination: Secondary | ICD-10-CM | POA: Insufficient documentation

## 2024-01-06 DIAGNOSIS — E785 Hyperlipidemia, unspecified: Secondary | ICD-10-CM | POA: Diagnosis not present

## 2024-01-06 DIAGNOSIS — Z1721 Progesterone receptor positive status: Secondary | ICD-10-CM | POA: Diagnosis not present

## 2024-01-06 DIAGNOSIS — I10 Essential (primary) hypertension: Secondary | ICD-10-CM | POA: Insufficient documentation

## 2024-01-06 MED ORDER — CHLORHEXIDINE GLUCONATE CLOTH 2 % EX PADS: 6.0000 | MEDICATED_PAD | Freq: Once | CUTANEOUS | Status: DC

## 2024-01-06 MED ORDER — ENSURE PRE-SURGERY PO LIQD: 296.0000 mL | Freq: Once | ORAL | Status: DC

## 2024-01-11 ENCOUNTER — Inpatient Hospital Stay
Admission: RE | Admit: 2024-01-11 | Discharge: 2024-01-11 | Disposition: A | Source: Ambulatory Visit | Attending: Surgery | Admitting: Surgery

## 2024-01-11 ENCOUNTER — Ambulatory Visit
Admission: RE | Admit: 2024-01-11 | Discharge: 2024-01-11 | Disposition: A | Discharge status: Home / Self Care | Source: Ambulatory Visit | Attending: Surgery | Admitting: Surgery

## 2024-01-11 DIAGNOSIS — D0512 Intraductal carcinoma in situ of left breast: Secondary | ICD-10-CM

## 2024-01-11 HISTORY — PX: BREAST BIOPSY: SHX20

## 2024-01-11 NOTE — H&P (Signed)
 REFERRING PHYSICIAN: Loretha Linnette Lane Mamie* PROVIDER: VICENTA DASIE POLI, MD MRN: [id number] DOB: 1961-02-08 DATE OF ENCOUNTER: 12/29/2023 Subjective   Chief Complaint: Breast Cancer  History of Present Illness: Taylor Holden is a 63 y.o. female who is seen  as an office consultation for evaluation of Breast Cancer  This is a pleasant 63 year old female who was found on recent screen mammography of calcifications in the left breast. This is in the central upper left breast. The area measured 5 cm x 3 cm. She had 2 biopsy of the area showing intermediate grade DCIS. This was 100% ER positive and 90% PR positive. She has had no previous problems regarding her breast. She denies nipple discharge. She is otherwise healthy without complaints. She has no cardiopulmonary issues  Review of Systems: A complete review of systems was obtained from the patient. I have reviewed this information and discussed as appropriate with the patient. See HPI as well for other ROS.  ROS   Medical History: Past Medical History:  Diagnosis Date  Arthritis  Glaucoma (increased eye pressure)  Hyperlipidemia  Hypertension  Liver disease   Patient Active Problem List  Diagnosis  Fatigue  DJD (degenerative joint disease)  Ductal carcinoma in situ (DCIS) of left breast  Hypertensive disorder  Glaucoma  Hyperlipidemia  Nonspecific abnormal electrocardiogram (ECG) (EKG)  Nonspecific elevation of levels of transaminase or lactic acid dehydrogenase (LDH)  Obesity (BMI 30.0-34.9)  Personal history of urinary calculi  Postmenopausal syndrome  Snoring  Vitamin D  deficiency  Annual physical exam   Past Surgical History:  Procedure Laterality Date  CESAREAN SECTION N/A 04/1998  CHOLECYSTECTOMY N/A 08/1998  Eye surgery (Right) Right 01/2014  Glaucoma surgery (Bilateral) Bilateral 03/2020  Cataract extraction, bilateral (Bilateral) Bilateral 07/2020  Total hip arthroplasty (Right) Right 05/15/2022   12/23/2023 Breast biopsy (Left) Left 12/23/2023  12/23/2023 Breast biopsy (Left) Left 12/23/2023  Wisdom tooth extraction N/A  Date Unknown    Allergies  Allergen Reactions  Cephalexin Anaphylaxis, Hives, Shortness Of Breath and Swelling  Penicillins Hives, Shortness Of Breath and Swelling  Facial swelling , Has patient had a PCN reaction causing immediate rash, facial/tongue/throat swelling, SOB or lightheadedness with hypotension: Yes, Has patient had a PCN reaction causing severe rash involving mucus membranes or skin necrosis: Yes, Has patient had a PCN reaction that required hospitalization: No, Has patient had a PCN reaction occurring within the last 10 years: No, If all of the above answers are NO, then may proceed with Cephalosporin use.  Facial swelling  Has patient had a PCN reaction causing immediate rash, facial/tongue/throat swelling, SOB or lightheadedness with hypotension: Yes Has patient had a PCN reaction causing severe rash involving mucus membranes or skin necrosis: Yes Has patient had a PCN reaction that required hospitalization: No Has patient had a PCN reaction occurring within the last 10 years: No If all of the above answers are NO, then may proceed with Cephalosporin use.  Metronidazole Other (See Comments)  Prednisone Swelling  Facial swelling and joint swelling   Facial swelling and joint swelling  Ergocalciferol  (Vitamin D2) Other (See Comments)  Nosebleed, tolerates low Vit D doses   Current Outpatient Medications on File Prior to Visit  Medication Sig Dispense Refill  acetaminophen (TYLENOL) 500 MG tablet Take 1,000 mg by mouth every 8 (eight) hours as needed for Pain (Headache)  CELEBREX 200 mg capsule Take 200 mg by mouth 2 (two) times daily as needed  cholecalciferol (VITAMIN D3) 1000 unit capsule Take 1,000 Units by mouth  once daily  latanoprost (XALATAN) 0.005 % ophthalmic solution Place 1 drop into both eyes at bedtime  losartan  (COZAAR ) 100 MG  tablet Take 100 mg by mouth once daily  metoprolol  SUCCinate (TOPROL -XL) 50 MG XL tablet Take 50 mg by mouth once daily Take with or immediately following a meal   No current facility-administered medications on file prior to visit.   Family History  Problem Relation Age of Onset  High blood pressure (Hypertension) Mother  Stroke Father  High blood pressure (Hypertension) Father  Hyperlipidemia (Elevated cholesterol) Father  Diabetes Father  Obesity Sister  Hyperlipidemia (Elevated cholesterol) Sister  Diabetes Sister    Social History   Tobacco Use  Smoking Status Never  Smokeless Tobacco Never    Social History   Socioeconomic History  Marital status: Married  Tobacco Use  Smoking status: Never  Smokeless tobacco: Never  Vaping Use  Vaping status: Never Used  Substance and Sexual Activity  Alcohol use: Never  Drug use: Never   Objective:  There were no vitals filed for this visit.  There is no height or weight on file to calculate BMI.  Physical Exam   She appears well on exam  She has large breast bilaterally. There are no palpable breast masses. There is minimal ecchymosis from the biopsy of the left breast. There is no axillary adenopathy. The nipple areolar complexes appear normal  Labs, Imaging and Diagnostic Testing: I have reviewed her mammograms, ultrasound, and pathology results  Assessment and Plan:   Diagnoses and all orders for this visit:  Ductal carcinoma in situ (DCIS) of left breast   We have discussed her multiple disciplinary breast cancer, was this morning. I have discussed the diagnosis with the patient as well. From a surgical standpoint we next discussed breast conservation versus mastectomy. She is interested in breast conservation. I next explained proceeding with a radioactive seed guided bracketed left breast lumpectomy. I described the surgical procedure in detail. We discussed the risks which includes but is not limited to  bleeding, infection, the need for further surgery if margins are positive, seroma formation, cardiopulmonary issues and anesthesia, etc. We also discussed the potential for oncoplastic reduction given the large size of her breast and the possibility of a cosmetic defect lumpectomy alone. She is very interested in this. We will refer her to plastic surgery for evaluation as well. Surgery will also be scheduled regarding the lumpectomy. She and her husband understand and agree with the plans.

## 2024-01-12 ENCOUNTER — Other Ambulatory Visit: Payer: Self-pay

## 2024-01-12 ENCOUNTER — Ambulatory Visit (HOSPITAL_BASED_OUTPATIENT_CLINIC_OR_DEPARTMENT_OTHER): Admitting: Anesthesiology

## 2024-01-12 ENCOUNTER — Ambulatory Visit
Admission: RE | Admit: 2024-01-12 | Discharge: 2024-01-12 | Disposition: A | Discharge status: Home / Self Care | Source: Ambulatory Visit | Attending: Surgery | Admitting: Surgery

## 2024-01-12 ENCOUNTER — Encounter (HOSPITAL_BASED_OUTPATIENT_CLINIC_OR_DEPARTMENT_OTHER): Payer: Self-pay | Admitting: Surgery

## 2024-01-12 ENCOUNTER — Encounter (HOSPITAL_BASED_OUTPATIENT_CLINIC_OR_DEPARTMENT_OTHER)
Admission: RE | Disposition: A | Discharge status: Home / Self Care | Payer: Self-pay | Source: Home / Self Care | Attending: Surgery

## 2024-01-12 ENCOUNTER — Ambulatory Visit
Admission: RE | Admit: 2024-01-12 | Discharge: 2024-01-12 | Disposition: A | Source: Ambulatory Visit | Attending: Surgery | Admitting: Surgery

## 2024-01-12 ENCOUNTER — Ambulatory Visit (HOSPITAL_BASED_OUTPATIENT_CLINIC_OR_DEPARTMENT_OTHER)
Admission: RE | Admit: 2024-01-12 | Discharge: 2024-01-12 | Disposition: A | Discharge status: Home / Self Care | Attending: Surgery | Admitting: Surgery

## 2024-01-12 DIAGNOSIS — D0512 Intraductal carcinoma in situ of left breast: Secondary | ICD-10-CM

## 2024-01-12 DIAGNOSIS — I10 Essential (primary) hypertension: Secondary | ICD-10-CM

## 2024-01-12 HISTORY — PX: BREAST LUMPECTOMY WITH RADIOACTIVE SEED LOCALIZATION: SHX6424

## 2024-01-12 SURGERY — BREAST LUMPECTOMY WITH RADIOACTIVE SEED LOCALIZATION
Anesthesia: General | Site: Breast | Laterality: Left

## 2024-01-12 MED ORDER — MIDAZOLAM HCL 2 MG/2ML IJ SOLN
INTRAMUSCULAR | Status: DC | PRN
  Administered 2024-01-12 (×2): 1 mg via INTRAVENOUS

## 2024-01-12 MED ORDER — TRAMADOL HCL 50 MG PO TABS: 50.0000 mg | ORAL_TABLET | Freq: Four times a day (QID) | ORAL | 0 refills | Status: AC | PRN

## 2024-01-12 MED ORDER — CIPROFLOXACIN IN D5W 400 MG/200ML IV SOLN
INTRAVENOUS | Status: AC
  Filled 2024-01-12: qty 200

## 2024-01-12 MED ORDER — PHENYLEPHRINE HCL (PRESSORS) 10 MG/ML IV SOLN
INTRAVENOUS | Status: AC
  Filled 2024-01-12: qty 1

## 2024-01-12 MED ORDER — CIPROFLOXACIN IN D5W 400 MG/200ML IV SOLN
400.0000 mg | INTRAVENOUS | Status: AC
  Administered 2024-01-12: 400 mg via INTRAVENOUS

## 2024-01-12 MED ORDER — LIDOCAINE 2% (20 MG/ML) 5 ML SYRINGE
INTRAMUSCULAR | Status: AC
  Filled 2024-01-12: qty 5

## 2024-01-12 MED ORDER — MIDAZOLAM HCL 2 MG/2ML IJ SOLN
INTRAMUSCULAR | Status: AC
  Filled 2024-01-12: qty 2

## 2024-01-12 MED ORDER — ONDANSETRON HCL 4 MG/2ML IJ SOLN
INTRAMUSCULAR | Status: AC
  Filled 2024-01-12: qty 2

## 2024-01-12 MED ORDER — BUPIVACAINE-EPINEPHRINE 0.5% -1:200000 IJ SOLN
INTRAMUSCULAR | Status: DC | PRN
  Administered 2024-01-12: 20 mL

## 2024-01-12 MED ORDER — ACETAMINOPHEN 500 MG PO TABS
1000.0000 mg | ORAL_TABLET | ORAL | Status: AC
  Administered 2024-01-12: 1000 mg via ORAL

## 2024-01-12 MED ORDER — PHENYLEPHRINE HCL-NACL 20-0.9 MG/250ML-% IV SOLN
INTRAVENOUS | Status: DC | PRN
  Administered 2024-01-12: 50 ug/min via INTRAVENOUS

## 2024-01-12 MED ORDER — FENTANYL CITRATE (PF) 100 MCG/2ML IJ SOLN: 25.0000 ug | INTRAMUSCULAR | Status: DC | PRN

## 2024-01-12 MED ORDER — OXYCODONE HCL 5 MG PO TABS
ORAL_TABLET | ORAL | Status: AC
  Filled 2024-01-12: qty 1

## 2024-01-12 MED ORDER — ONDANSETRON HCL 4 MG/2ML IJ SOLN
INTRAMUSCULAR | Status: DC | PRN
  Administered 2024-01-12: 4 mg via INTRAVENOUS

## 2024-01-12 MED ORDER — PROPOFOL 500 MG/50ML IV EMUL
INTRAVENOUS | Status: AC
  Filled 2024-01-12: qty 50

## 2024-01-12 MED ORDER — FENTANYL CITRATE (PF) 100 MCG/2ML IJ SOLN
INTRAMUSCULAR | Status: AC
  Filled 2024-01-12: qty 2

## 2024-01-12 MED ORDER — PROPOFOL 10 MG/ML IV BOLUS
INTRAVENOUS | Status: DC | PRN
  Administered 2024-01-12: 50 mg via INTRAVENOUS
  Administered 2024-01-12: 150 mg via INTRAVENOUS

## 2024-01-12 MED ORDER — BUPIVACAINE-EPINEPHRINE (PF) 0.5% -1:200000 IJ SOLN
INTRAMUSCULAR | Status: AC
  Filled 2024-01-12: qty 30

## 2024-01-12 MED ORDER — AMISULPRIDE (ANTIEMETIC) 5 MG/2ML IV SOLN: 10.0000 mg | Freq: Once | INTRAVENOUS | Status: DC | PRN

## 2024-01-12 MED ORDER — EPHEDRINE 5 MG/ML INJ
INTRAVENOUS | Status: AC
  Filled 2024-01-12: qty 5

## 2024-01-12 MED ORDER — ACETAMINOPHEN 500 MG PO TABS
ORAL_TABLET | ORAL | Status: AC
  Filled 2024-01-12: qty 2

## 2024-01-12 MED ORDER — OXYCODONE HCL 5 MG PO TABS
5.0000 mg | ORAL_TABLET | Freq: Once | ORAL | Status: AC | PRN
  Administered 2024-01-12: 5 mg via ORAL

## 2024-01-12 MED ORDER — PHENYLEPHRINE 80 MCG/ML (10ML) SYRINGE FOR IV PUSH (FOR BLOOD PRESSURE SUPPORT)
PREFILLED_SYRINGE | INTRAVENOUS | Status: DC | PRN
  Administered 2024-01-12 (×4): 80 ug via INTRAVENOUS

## 2024-01-12 MED ORDER — LACTATED RINGERS IV SOLN: INTRAVENOUS | Status: DC

## 2024-01-12 MED ORDER — FENTANYL CITRATE (PF) 100 MCG/2ML IJ SOLN
INTRAMUSCULAR | Status: DC | PRN
  Administered 2024-01-12 (×2): 50 ug via INTRAVENOUS

## 2024-01-12 MED ORDER — PHENYLEPHRINE 80 MCG/ML (10ML) SYRINGE FOR IV PUSH (FOR BLOOD PRESSURE SUPPORT)
PREFILLED_SYRINGE | INTRAVENOUS | Status: AC
  Filled 2024-01-12: qty 10

## 2024-01-12 MED ORDER — OXYCODONE HCL 5 MG/5ML PO SOLN: 5.0000 mg | Freq: Once | ORAL | Status: AC | PRN

## 2024-01-12 MED ORDER — LIDOCAINE 2% (20 MG/ML) 5 ML SYRINGE
INTRAMUSCULAR | Status: DC | PRN
  Administered 2024-01-12: 60 mg via INTRAVENOUS

## 2024-01-12 SURGICAL SUPPLY — 42 items
BINDER BREAST 3XL (GAUZE/BANDAGES/DRESSINGS) IMPLANT
BINDER BREAST LRG (GAUZE/BANDAGES/DRESSINGS) IMPLANT
BINDER BREAST MEDIUM (GAUZE/BANDAGES/DRESSINGS) IMPLANT
BINDER BREAST XLRG (GAUZE/BANDAGES/DRESSINGS) IMPLANT
BINDER BREAST XXLRG (GAUZE/BANDAGES/DRESSINGS) IMPLANT
BLADE SURG 15 STRL LF DISP TIS (BLADE) ×1 IMPLANT
CANISTER SUC SOCK COL 7IN (MISCELLANEOUS) IMPLANT
CANISTER SUCT 1200ML W/VALVE (MISCELLANEOUS) IMPLANT
CHLORAPREP W/TINT 26 (MISCELLANEOUS) ×1 IMPLANT
CLIP APPLIE 9.375 MED OPEN (MISCELLANEOUS) IMPLANT
COVER BACK TABLE 60X90IN (DRAPES) ×1 IMPLANT
COVER MAYO STAND STRL (DRAPES) ×1 IMPLANT
COVER PROBE CYLINDRICAL 5X96 (MISCELLANEOUS) ×1 IMPLANT
DERMABOND ADVANCED .7 DNX12 (GAUZE/BANDAGES/DRESSINGS) ×1 IMPLANT
DRAPE LAPAROSCOPIC ABDOMINAL (DRAPES) ×1 IMPLANT
DRAPE UTILITY XL STRL (DRAPES) ×1 IMPLANT
ELECTRODE REM PT RTRN 9FT ADLT (ELECTROSURGICAL) ×1 IMPLANT
GAUZE SPONGE 4X4 12PLY STRL LF (GAUZE/BANDAGES/DRESSINGS) IMPLANT
GLOVE BIO SURGEON STRL SZ 6.5 (GLOVE) IMPLANT
GLOVE BIOGEL PI IND STRL 7.0 (GLOVE) IMPLANT
GLOVE SURG SIGNA 7.5 PF LTX (GLOVE) ×1 IMPLANT
GLOVE SURG SS PI 7.5 STRL IVOR (GLOVE) IMPLANT
GOWN STRL REUS W/ TWL LRG LVL3 (GOWN DISPOSABLE) ×1 IMPLANT
GOWN STRL REUS W/ TWL XL LVL3 (GOWN DISPOSABLE) ×1 IMPLANT
GOWN STRL SURGICAL XL XLNG (GOWN DISPOSABLE) IMPLANT
KIT MARKER MARGIN INK (KITS) ×1 IMPLANT
NDL HYPO 25X1 1.5 SAFETY (NEEDLE) ×1 IMPLANT
NEEDLE HYPO 25X1 1.5 SAFETY (NEEDLE) ×1 IMPLANT
NS IRRIG 1000ML POUR BTL (IV SOLUTION) IMPLANT
PACK BASIN DAY SURGERY FS (CUSTOM PROCEDURE TRAY) ×1 IMPLANT
PENCIL SMOKE EVACUATOR (MISCELLANEOUS) ×1 IMPLANT
SLEEVE SCD COMPRESS KNEE MED (STOCKING) ×1 IMPLANT
SPIKE FLUID TRANSFER (MISCELLANEOUS) IMPLANT
SPONGE T-LAP 4X18 ~~LOC~~+RFID (SPONGE) ×1 IMPLANT
SUT MNCRL AB 4-0 PS2 18 (SUTURE) ×1 IMPLANT
SUT SILK 2 0 SH (SUTURE) IMPLANT
SUT VIC AB 3-0 SH 27X BRD (SUTURE) ×1 IMPLANT
SYR CONTROL 10ML LL (SYRINGE) ×1 IMPLANT
TOWEL GREEN STERILE FF (TOWEL DISPOSABLE) ×1 IMPLANT
TRAY FAXITRON CT DISP (TRAY / TRAY PROCEDURE) ×1 IMPLANT
TUBE CONNECTING 20X1/4 (TUBING) IMPLANT
YANKAUER SUCT BULB TIP NO VENT (SUCTIONS) IMPLANT

## 2024-01-12 NOTE — Anesthesia Postprocedure Evaluation (Signed)
 Anesthesia Post Note  Patient: Taylor Holden  Procedure(s) Performed: RADIOACTIVE SEED GUIDED LEFT BREAST BRACKETED LUMPECTOMY (Left: Breast)     Patient location during evaluation: PACU Anesthesia Type: General Level of consciousness: awake and alert Pain management: pain level controlled Vital Signs Assessment: post-procedure vital signs reviewed and stable Respiratory status: spontaneous breathing, nonlabored ventilation, respiratory function stable and patient connected to nasal cannula oxygen Cardiovascular status: blood pressure returned to baseline and stable Postop Assessment: no apparent nausea or vomiting Anesthetic complications: no   No notable events documented.  Last Vitals:  Vitals:   01/12/24 1015 01/12/24 1040  BP: 136/69 128/76  Pulse: 80 77  Resp: 10 16  Temp:  (!) 36.3 C  SpO2: 94% 95%    Last Pain:  Vitals:   01/12/24 1037  TempSrc:   PainSc: 4                  Waynette Towers L Hasan Douse

## 2024-01-12 NOTE — Transfer of Care (Signed)
 Immediate Anesthesia Transfer of Care Note  Patient: Taylor Holden  Procedure(s) Performed: Procedure(s) (LRB): RADIOACTIVE SEED GUIDED LEFT BREAST BRACKETED LUMPECTOMY (Left)  Patient Location: PACU  Anesthesia Type: General  Level of Consciousness: awake, oriented, sedated and patient cooperative  Airway & Oxygen Therapy: Patient Spontanous Breathing and Patient connected to nasal cannula oxygen  Post-op Assessment: Report given to PACU RN and Post -op Vital signs reviewed and stable  Post vital signs: Reviewed and stable  Complications: No apparent anesthesia complications Last Vitals:  Vitals Value Taken Time  BP 128/59 01/12/24 09:45  Temp    Pulse 90 01/12/24 09:47  Resp 27 01/12/24 09:47  SpO2 94 % 01/12/24 09:47  Vitals shown include unfiled device data.  Last Pain:  Vitals:   01/12/24 9287  TempSrc: Tympanic  PainSc: 0-No pain      Patients Stated Pain Goal: 7 (01/12/24 9287)  Complications: No notable events documented.

## 2024-01-12 NOTE — Op Note (Signed)
   Taylor Holden 01/12/2024   Pre-op Diagnosis: LEFT BREAST DUCTAL CARCINOMA IN SITU     Post-op Diagnosis: same  Procedure(s): RADIOACTIVE SEED GUIDED LEFT BREAST BRACKETED LUMPECTOMY  Surgeon(s): Vernetta Berg, MD Maczis, Tonja Barban, PA-C  Anesthesia: General  Staff:  Circulator: Elaine Avelina PARAS, RN Scrub Person: Waddell Silvano HERO, RN Circulator Assistant: Wilmon Antonio SQUIBB, RN  Estimated Blood Loss: Minimal               Specimens: sent to path  Indications: This is a 63 year old female who was found on recent screening mammography to have a 5 cm area of calcifications in the upper inner quadrant of the left breast.  She had a biopsy showing ductal carcinoma in situ.  Given the extent of the area decision was made to proceed with a bracketed lumpectomy.  Depending on the margins this will then be followed by an oncoplastic reduction by plastic surgery  Procedure: The patient was brought to the op room identified as correct patient.  She was placed upon the operating table general anesthesia induced.  Her left breast was then prepped and draped in usual sterile fashion.  Using the neoprobe I located the radioactive seeds in the upper inner quadrant of the left breast.  The patient had been marked preoperatively by plastic surgery.  Using one of the marks I then created an incision in the upper outer quadrant with a scalpel.  I then dissected down circumferentially into the breast tissue with the cautery.  I then dissected medially through the most medial radioactive seed and once I got more medial to the seed I then dissected on the chest wall.  I then dissected superiorly and inferiorly approaching the other 2 radioactive seeds in the coming back toward the 12 o'clock position.  I think dissection LV down the chest wall and then was able to come underneath all the signals with the neoprobe aiding the dissection.  I then completed a large lumpectomy with electrocautery.  I marked  all margins with paint.  An x-ray was performed confirming that the biopsy clip and 3 radioactive seeds were in the center of the specimen.  The specimen was then sent to pathology for evaluation.  I achieved hemostasis with cautery.  I injected further Marcaine into the incision and lumpectomy site.  I then placed surgical clips around the periphery of the lumpectomy cavity.  We then closed the subcutaneous tissue with interrupted 3-0 Vicryl sutures and closed skin with a running 4-0 Monocryl.  Dermabond was then applied.  The patient tolerated the procedure well.  All the counts were correct at the end of the procedure.  The patient was then placed in a breast binder.  She was then extubated in the operating room and taken in stable condition to the recovery room.          Berg Vernetta   Date: 01/12/2024  Time: 9:24 AM

## 2024-01-12 NOTE — Anesthesia Preprocedure Evaluation (Addendum)
 Anesthesia Evaluation  Patient identified by MRN, date of birth, ID band Patient awake    Reviewed: Allergy & Precautions, NPO status , Patient's Chart, lab work & pertinent test results, reviewed documented beta blocker date and time   Airway Mallampati: I  TM Distance: >3 FB Neck ROM: Full    Dental no notable dental hx. (+) Teeth Intact, Dental Advisory Given   Pulmonary neg pulmonary ROS   Pulmonary exam normal breath sounds clear to auscultation       Cardiovascular hypertension, Pt. on home beta blockers and Pt. on medications Normal cardiovascular exam Rhythm:Regular Rate:Normal  HLD   Neuro/Psych negative neurological ROS  negative psych ROS   GI/Hepatic negative GI ROS, Neg liver ROS,,,  Endo/Other  negative endocrine ROS    Renal/GU negative Renal ROS  negative genitourinary   Musculoskeletal  (+) Arthritis ,    Abdominal   Peds  Hematology negative hematology ROS (+)   Anesthesia Other Findings   Reproductive/Obstetrics                              Anesthesia Physical Anesthesia Plan  ASA: 2  Anesthesia Plan: General   Post-op Pain Management: Tylenol PO (pre-op)*   Induction: Intravenous  PONV Risk Score and Plan: 3 and Ondansetron  and Midazolam   Airway Management Planned: LMA  Additional Equipment:   Intra-op Plan:   Post-operative Plan: Extubation in OR  Informed Consent: I have reviewed the patients History and Physical, chart, labs and discussed the procedure including the risks, benefits and alternatives for the proposed anesthesia with the patient or authorized representative who has indicated his/her understanding and acceptance.     Dental advisory given  Plan Discussed with: CRNA  Anesthesia Plan Comments:          Anesthesia Quick Evaluation

## 2024-01-12 NOTE — Anesthesia Procedure Notes (Signed)
 Procedure Name: LMA Insertion Date/Time: 01/12/2024 8:40 AM  Performed by: Delayne Olam BIRCH, CRNAPre-anesthesia Checklist: Patient identified, Emergency Drugs available, Suction available and Patient being monitored Patient Re-evaluated:Patient Re-evaluated prior to induction Oxygen Delivery Method: Circle system utilized Preoxygenation: Pre-oxygenation with 100% oxygen Induction Type: IV induction Ventilation: Mask ventilation without difficulty LMA: LMA inserted LMA Size: 4.0 Number of attempts: 1 Airway Equipment and Method: Bite block Placement Confirmation: positive ETCO2 Tube secured with: Tape Dental Injury: Teeth and Oropharynx as per pre-operative assessment

## 2024-01-12 NOTE — Interval H&P Note (Signed)
 History and Physical Interval Note:no change in H and P  01/12/2024 7:20 AM  [name]  has presented today for surgery, with the diagnosis of LEFT BREAST DUCTAL CARCINOMA IN SITU.  The various methods of treatment have been discussed with the patient and family. After consideration of risks, benefits and other options for treatment, the patient has consented to  Procedure(s) with comments: BREAST LUMPECTOMY WITH RADIOACTIVE SEED LOCALIZATION (Left) - LEFT BREAST RADIOACTIVE SEED GUIDED BRACKETED LUMPECTOMY as a surgical intervention.  The patient's history has been reviewed, patient examined, no change in status, stable for surgery.  I have reviewed the patient's chart and labs.  Questions were answered to the patient's satisfaction.     Taylor Holden

## 2024-01-12 NOTE — Discharge Instructions (Addendum)
 No Tylenol before 1pm today.   Post Anesthesia Home Care Instructions  Activity: Get plenty of rest for the remainder of the day. A responsible individual must stay with you for 24 hours following the procedure.  For the next 24 hours, DO NOT: -Drive a car -Advertising copywriter -Drink alcoholic beverages -Take any medication unless instructed by your physician -Make any legal decisions or sign important papers.  Meals: Start with liquid foods such as gelatin or soup. Progress to regular foods as tolerated. Avoid greasy, spicy, heavy foods. If nausea and/or vomiting occur, drink only clear liquids until the nausea and/or vomiting subsides. Call your physician if vomiting continues.  Special Instructions/Symptoms: Your throat may feel dry or sore from the anesthesia or the breathing tube placed in your throat during surgery. If this causes discomfort, gargle with warm salt water. The discomfort should disappear within 24 hours.  If you had a scopolamine patch placed behind your ear for the management of post- operative nausea and/or vomiting:  1. The medication in the patch is effective for 72 hours, after which it should be removed.  Wrap patch in a tissue and discard in the trash. Wash hands thoroughly with soap and water. 2. You may remove the patch earlier than 72 hours if you experience unpleasant side effects which may include dry mouth, dizziness or visual disturbances. 3. Avoid touching the patch. Wash your hands with soap and water after contact with the patch.       [facility] Office Phone Number [phone]  BREAST BIOPSY/ PARTIAL MASTECTOMY: POST OP INSTRUCTIONS  Always review your discharge instruction sheet given to you by the facility where your surgery was performed.  IF YOU HAVE DISABILITY OR FAMILY LEAVE FORMS, YOU MUST BRING THEM TO THE OFFICE FOR PROCESSING.  DO NOT GIVE THEM TO YOUR DOCTOR.  A prescription for pain medication may be given to you  upon discharge.  Take your pain medication as prescribed, if needed.  If narcotic pain medicine is not needed, then you may take acetaminophen (Tylenol) or ibuprofen (Advil) as needed. Take your usually prescribed medications unless otherwise directed If you need a refill on your pain medication, please contact your pharmacy.  They will contact our office to request authorization.  Prescriptions will not be filled after 5pm or on week-ends. You should eat very light the first 24 hours after surgery, such as soup, crackers, pudding, etc.  Resume your normal diet the day after surgery. Most patients will experience some swelling and bruising in the breast.  Ice packs and a good support bra will help.  Swelling and bruising can take several days to resolve.  It is common to experience some constipation if taking pain medication after surgery.  Increasing fluid intake and taking a stool softener will usually help or prevent this problem from occurring.  A mild laxative (Milk of Magnesia or Miralax) should be taken according to package directions if there are no bowel movements after 48 hours. Unless discharge instructions indicate otherwise, you may remove your bandages 24-48 hours after surgery, and you may shower at that time.  You may have steri-strips (small skin tapes) in place directly over the incision.  These strips should be left on the skin for 7-10 days.  If your surgeon used skin glue on the incision, you may shower in 24 hours.  The glue will flake off over the next 2-3 weeks.  Any sutures or staples will be removed at the office during your follow-up visit.  ACTIVITIES:  You may resume regular daily activities (gradually increasing) beginning the next day.  Wearing a good support bra or sports bra minimizes pain and swelling.  You may have sexual intercourse when it is comfortable. You may drive when you no longer are taking prescription pain medication, you can comfortably wear a seatbelt, and you  can safely maneuver your car and apply brakes. RETURN TO WORK:  ______________________________________________________________________________________ Rosine should see your doctor in the office for a follow-up appointment approximately two weeks after your surgery.  Your doctor's nurse will typically make your follow-up appointment when she calls you with your pathology report.  Expect your pathology report 2-3 business days after your surgery.  You may call to check if you do not hear from us  after three days. OTHER INSTRUCTIONS: YOU MAY REMOVE THE BINDER AND SHOWER STARTING TOMORROW ICE PACK, TYLENOL, AND IBUPROFEN ALSO FOR PAIN NO VIGOROUS ACTIVITY FOR ONE WEEK _______________________________________________________________________________________________ _____________________________________________________________________________________________________________________________________ _____________________________________________________________________________________________________________________________________ _____________________________________________________________________________________________________________________________________  WHEN TO CALL YOUR DOCTOR: Fever over 101.0 Nausea and/or vomiting. Extreme swelling or bruising. Continued bleeding from incision. Increased pain, redness, or drainage from the incision.  The clinic staff is available to answer your questions during regular business hours.  Please don't hesitate to call and ask to speak to one of the nurses for clinical concerns.  If you have a medical emergency, go to the nearest emergency room or call 911.  A surgeon from [facility] is always on call at the hospital.  For further questions, please visit [email or web address]    Post Anesthesia Home Care Instructions  Activity: Get plenty of rest for the remainder of the day. A responsible individual must stay with you for 24 hours following  the procedure.  For the next 24 hours, DO NOT: -Drive a car -Advertising copywriter -Drink alcoholic beverages -Take any medication unless instructed by your physician -Make any legal decisions or sign important papers.  Meals: Start with liquid foods such as gelatin or soup. Progress to regular foods as tolerated. Avoid greasy, spicy, heavy foods. If nausea and/or vomiting occur, drink only clear liquids until the nausea and/or vomiting subsides. Call your physician if vomiting continues.  Special Instructions/Symptoms: Your throat may feel dry or sore from the anesthesia or the breathing tube placed in your throat during surgery. If this causes discomfort, gargle with warm salt water. The discomfort should disappear within 24 hours.  May have Tylenol after 1:15pm if needed.

## 2024-01-13 ENCOUNTER — Encounter (HOSPITAL_BASED_OUTPATIENT_CLINIC_OR_DEPARTMENT_OTHER): Payer: Self-pay | Admitting: Surgery

## 2024-01-14 ENCOUNTER — Encounter (HOSPITAL_BASED_OUTPATIENT_CLINIC_OR_DEPARTMENT_OTHER): Payer: Self-pay | Admitting: Plastic Surgery

## 2024-01-14 ENCOUNTER — Other Ambulatory Visit: Payer: Self-pay

## 2024-01-14 LAB — SURGICAL PATHOLOGY

## 2024-01-17 MED ORDER — CHLORHEXIDINE GLUCONATE CLOTH 2 % EX PADS: 6.0000 | MEDICATED_PAD | Freq: Once | CUTANEOUS | Status: DC

## 2024-01-17 NOTE — Progress Notes (Signed)

## 2024-01-19 ENCOUNTER — Encounter: Payer: Self-pay | Admitting: *Deleted

## 2024-01-21 ENCOUNTER — Ambulatory Visit (HOSPITAL_BASED_OUTPATIENT_CLINIC_OR_DEPARTMENT_OTHER)
Admission: RE | Admit: 2024-01-21 | Discharge: 2024-01-21 | Disposition: A | Discharge status: Home / Self Care | Attending: Plastic Surgery | Admitting: Plastic Surgery

## 2024-01-21 ENCOUNTER — Other Ambulatory Visit: Payer: Self-pay

## 2024-01-21 ENCOUNTER — Encounter (HOSPITAL_BASED_OUTPATIENT_CLINIC_OR_DEPARTMENT_OTHER)
Admission: RE | Disposition: A | Discharge status: Home / Self Care | Payer: Self-pay | Source: Home / Self Care | Attending: Plastic Surgery

## 2024-01-21 ENCOUNTER — Encounter (HOSPITAL_BASED_OUTPATIENT_CLINIC_OR_DEPARTMENT_OTHER): Payer: Self-pay | Admitting: Plastic Surgery

## 2024-01-21 ENCOUNTER — Ambulatory Visit (HOSPITAL_BASED_OUTPATIENT_CLINIC_OR_DEPARTMENT_OTHER): Admitting: Anesthesiology

## 2024-01-21 DIAGNOSIS — N62 Hypertrophy of breast: Secondary | ICD-10-CM | POA: Diagnosis present

## 2024-01-21 DIAGNOSIS — Z170 Estrogen receptor positive status [ER+]: Secondary | ICD-10-CM | POA: Insufficient documentation

## 2024-01-21 DIAGNOSIS — Z01818 Encounter for other preprocedural examination: Secondary | ICD-10-CM

## 2024-01-21 DIAGNOSIS — M1990 Unspecified osteoarthritis, unspecified site: Secondary | ICD-10-CM | POA: Diagnosis not present

## 2024-01-21 DIAGNOSIS — D0512 Intraductal carcinoma in situ of left breast: Secondary | ICD-10-CM | POA: Diagnosis not present

## 2024-01-21 DIAGNOSIS — I10 Essential (primary) hypertension: Secondary | ICD-10-CM | POA: Diagnosis not present

## 2024-01-21 DIAGNOSIS — E785 Hyperlipidemia, unspecified: Secondary | ICD-10-CM | POA: Insufficient documentation

## 2024-01-21 DIAGNOSIS — Z1721 Progesterone receptor positive status: Secondary | ICD-10-CM | POA: Insufficient documentation

## 2024-01-21 HISTORY — DX: Nausea with vomiting, unspecified: R11.2

## 2024-01-21 HISTORY — PX: BREAST REDUCTION SURGERY: SHX8

## 2024-01-21 HISTORY — DX: Other complications of anesthesia, initial encounter: T88.59XA

## 2024-01-21 HISTORY — PX: BREAST RECONSTRUCTION: SHX9

## 2024-01-21 SURGERY — RECONSTRUCTION, BREAST
Anesthesia: General | Site: Breast | Laterality: Right

## 2024-01-21 MED ORDER — OXYCODONE HCL 5 MG PO TABS: 5.0000 mg | ORAL_TABLET | Freq: Once | ORAL | Status: DC | PRN

## 2024-01-21 MED ORDER — LIDOCAINE 2% (20 MG/ML) 5 ML SYRINGE
INTRAMUSCULAR | Status: AC
  Filled 2024-01-21: qty 5

## 2024-01-21 MED ORDER — MIDAZOLAM HCL 5 MG/5ML IJ SOLN
INTRAMUSCULAR | Status: DC | PRN
  Administered 2024-01-21: 2 mg via INTRAVENOUS

## 2024-01-21 MED ORDER — LIDOCAINE HCL (CARDIAC) PF 100 MG/5ML IV SOSY
PREFILLED_SYRINGE | INTRAVENOUS | Status: DC | PRN
  Administered 2024-01-21: 100 mg via INTRAVENOUS

## 2024-01-21 MED ORDER — 0.9 % SODIUM CHLORIDE (POUR BTL) OPTIME
TOPICAL | Status: DC | PRN
  Administered 2024-01-21: 1000 mL

## 2024-01-21 MED ORDER — CELECOXIB 200 MG PO CAPS
ORAL_CAPSULE | ORAL | Status: AC
  Filled 2024-01-21: qty 1

## 2024-01-21 MED ORDER — ACETAMINOPHEN 500 MG PO TABS
ORAL_TABLET | ORAL | Status: AC
  Filled 2024-01-21: qty 2

## 2024-01-21 MED ORDER — CELECOXIB 200 MG PO CAPS
200.0000 mg | ORAL_CAPSULE | ORAL | Status: AC
  Administered 2024-01-21: 200 mg via ORAL

## 2024-01-21 MED ORDER — PROPOFOL 500 MG/50ML IV EMUL
INTRAVENOUS | Status: AC
  Filled 2024-01-21: qty 50

## 2024-01-21 MED ORDER — ONDANSETRON HCL 4 MG/2ML IJ SOLN
INTRAMUSCULAR | Status: AC
  Filled 2024-01-21: qty 2

## 2024-01-21 MED ORDER — DROPERIDOL 2.5 MG/ML IJ SOLN
INTRAMUSCULAR | Status: AC
Start: 2024-01-21 — End: 2024-01-21
  Filled 2024-01-21: qty 2

## 2024-01-21 MED ORDER — CIPROFLOXACIN IN D5W 400 MG/200ML IV SOLN
INTRAVENOUS | Status: AC
  Filled 2024-01-21: qty 200

## 2024-01-21 MED ORDER — FENTANYL CITRATE (PF) 100 MCG/2ML IJ SOLN
INTRAMUSCULAR | Status: AC
  Filled 2024-01-21: qty 2

## 2024-01-21 MED ORDER — FENTANYL CITRATE (PF) 100 MCG/2ML IJ SOLN
INTRAMUSCULAR | Status: DC | PRN
  Administered 2024-01-21 (×2): 50 ug via INTRAVENOUS

## 2024-01-21 MED ORDER — ACETAMINOPHEN 500 MG PO TABS
1000.0000 mg | ORAL_TABLET | ORAL | Status: AC
  Administered 2024-01-21: 1000 mg via ORAL

## 2024-01-21 MED ORDER — HYDROMORPHONE HCL 1 MG/ML IJ SOLN: 0.2500 mg | INTRAMUSCULAR | Status: DC | PRN

## 2024-01-21 MED ORDER — HYDROMORPHONE HCL 1 MG/ML IJ SOLN
INTRAMUSCULAR | Status: AC
  Filled 2024-01-21: qty 0.5

## 2024-01-21 MED ORDER — KETAMINE HCL 10 MG/ML IJ SOLN
INTRAMUSCULAR | Status: DC | PRN
  Administered 2024-01-21 (×2): 20 mg via INTRAVENOUS

## 2024-01-21 MED ORDER — CIPROFLOXACIN IN D5W 400 MG/200ML IV SOLN
400.0000 mg | INTRAVENOUS | Status: AC
Start: 2024-01-21 — End: 2024-01-21
  Administered 2024-01-21: 400 mg via INTRAVENOUS

## 2024-01-21 MED ORDER — BUPIVACAINE HCL (PF) 0.5 % IJ SOLN
INTRAMUSCULAR | Status: DC | PRN
  Administered 2024-01-21: 30 mL

## 2024-01-21 MED ORDER — KETAMINE HCL 50 MG/5ML IJ SOSY
PREFILLED_SYRINGE | INTRAMUSCULAR | Status: AC
  Filled 2024-01-21: qty 5

## 2024-01-21 MED ORDER — GABAPENTIN 300 MG PO CAPS
300.0000 mg | ORAL_CAPSULE | ORAL | Status: AC
  Administered 2024-01-21: 300 mg via ORAL

## 2024-01-21 MED ORDER — OXYCODONE HCL 5 MG/5ML PO SOLN: 5.0000 mg | Freq: Once | ORAL | Status: DC | PRN

## 2024-01-21 MED ORDER — PROPOFOL 10 MG/ML IV BOLUS
INTRAVENOUS | Status: AC
Start: 2024-01-21 — End: 2024-01-21
  Filled 2024-01-21: qty 20

## 2024-01-21 MED ORDER — DROPERIDOL 2.5 MG/ML IJ SOLN
0.6250 mg | Freq: Once | INTRAMUSCULAR | Status: AC | PRN
  Administered 2024-01-21: 0.625 mg via INTRAVENOUS

## 2024-01-21 MED ORDER — EPHEDRINE 5 MG/ML INJ
INTRAVENOUS | Status: AC
  Filled 2024-01-21: qty 5

## 2024-01-21 MED ORDER — PROPOFOL 500 MG/50ML IV EMUL
INTRAVENOUS | Status: DC | PRN
Start: 2024-01-21 — End: 2024-01-21
  Administered 2024-01-21: 50 ug/kg/min via INTRAVENOUS

## 2024-01-21 MED ORDER — GABAPENTIN 300 MG PO CAPS
ORAL_CAPSULE | ORAL | Status: AC
  Filled 2024-01-21: qty 1

## 2024-01-21 MED ORDER — ONDANSETRON HCL 4 MG/2ML IJ SOLN
INTRAMUSCULAR | Status: DC | PRN
  Administered 2024-01-21: 4 mg via INTRAVENOUS

## 2024-01-21 MED ORDER — ROCURONIUM BROMIDE 100 MG/10ML IV SOLN
INTRAVENOUS | Status: DC | PRN
  Administered 2024-01-21: 60 mg via INTRAVENOUS

## 2024-01-21 MED ORDER — HYDROMORPHONE HCL 1 MG/ML IJ SOLN
INTRAMUSCULAR | Status: DC | PRN
  Administered 2024-01-21: .5 mg via INTRAVENOUS

## 2024-01-21 MED ORDER — SUGAMMADEX SODIUM 200 MG/2ML IV SOLN
INTRAVENOUS | Status: DC | PRN
Start: 2024-01-21 — End: 2024-01-21
  Administered 2024-01-21: 200 mg via INTRAVENOUS

## 2024-01-21 MED ORDER — ROCURONIUM BROMIDE 10 MG/ML (PF) SYRINGE
PREFILLED_SYRINGE | INTRAVENOUS | Status: AC
  Filled 2024-01-21: qty 10

## 2024-01-21 MED ORDER — LACTATED RINGERS IV SOLN: INTRAVENOUS | Status: DC

## 2024-01-21 MED ORDER — MIDAZOLAM HCL 2 MG/2ML IJ SOLN
INTRAMUSCULAR | Status: AC
Start: 2024-01-21 — End: 2024-01-21
  Filled 2024-01-21: qty 2

## 2024-01-21 MED ORDER — PHENYLEPHRINE HCL-NACL 20-0.9 MG/250ML-% IV SOLN
INTRAVENOUS | Status: DC | PRN
  Administered 2024-01-21: 25 ug/min via INTRAVENOUS

## 2024-01-21 MED ORDER — PROPOFOL 10 MG/ML IV BOLUS
INTRAVENOUS | Status: DC | PRN
  Administered 2024-01-21: 150 mg via INTRAVENOUS

## 2024-01-21 MED ORDER — EPHEDRINE SULFATE (PRESSORS) 50 MG/ML IJ SOLN
INTRAMUSCULAR | Status: DC | PRN
  Administered 2024-01-21: 5 mg via INTRAVENOUS
  Administered 2024-01-21: 10 mg via INTRAVENOUS

## 2024-01-21 SURGICAL SUPPLY — 62 items
BAG DECANTER FOR FLEXI CONT (MISCELLANEOUS) ×2 IMPLANT
BINDER BREAST 3XL (GAUZE/BANDAGES/DRESSINGS) IMPLANT
BINDER BREAST LRG (GAUZE/BANDAGES/DRESSINGS) IMPLANT
BINDER BREAST MEDIUM (GAUZE/BANDAGES/DRESSINGS) IMPLANT
BINDER BREAST XLRG (GAUZE/BANDAGES/DRESSINGS) IMPLANT
BINDER BREAST XXLRG (GAUZE/BANDAGES/DRESSINGS) IMPLANT
BLADE SURG 10 STRL SS (BLADE) ×8 IMPLANT
BLADE SURG 15 STRL LF DISP TIS (BLADE) IMPLANT
BNDG ELASTIC 6INX 5YD STR LF (GAUZE/BANDAGES/DRESSINGS) IMPLANT
BNDG GAUZE DERMACEA FLUFF 4 (GAUZE/BANDAGES/DRESSINGS) ×4 IMPLANT
CANISTER SUCT 1200ML W/VALVE (MISCELLANEOUS) ×2 IMPLANT
CHLORAPREP W/TINT 26 (MISCELLANEOUS) ×4 IMPLANT
CLIP APPLIE 9.375 MED OPEN (MISCELLANEOUS) IMPLANT
COVER BACK TABLE 60X90IN (DRAPES) ×2 IMPLANT
COVER MAYO STAND STRL (DRAPES) ×2 IMPLANT
DERMABOND ADVANCED .7 DNX12 (GAUZE/BANDAGES/DRESSINGS) ×4 IMPLANT
DRAIN CHANNEL 15F RND FF W/TCR (WOUND CARE) ×2 IMPLANT
DRAIN CHANNEL 19F RND (DRAIN) IMPLANT
DRAPE TOP ARMCOVERS (MISCELLANEOUS) ×2 IMPLANT
DRAPE U-SHAPE 76X120 STRL (DRAPES) ×2 IMPLANT
DRAPE UTILITY XL STRL (DRAPES) ×2 IMPLANT
ELECT COATED BLADE 2.86 ST (ELECTRODE) ×2 IMPLANT
ELECTRODE BLDE 4.0 EZ CLN MEGD (MISCELLANEOUS) ×2 IMPLANT
ELECTRODE REM PT RTRN 9FT ADLT (ELECTROSURGICAL) ×2 IMPLANT
EVACUATOR SILICONE 100CC (DRAIN) ×2 IMPLANT
GAUZE PAD ABD 8X10 STRL (GAUZE/BANDAGES/DRESSINGS) ×4 IMPLANT
GLOVE BIO SURGEON STRL SZ 6 (GLOVE) ×4 IMPLANT
GLOVE BIO SURGEON STRL SZ 6.5 (GLOVE) IMPLANT
GOWN STRL REUS W/ TWL LRG LVL3 (GOWN DISPOSABLE) ×4 IMPLANT
IV NS 500ML BAXH (IV SOLUTION) ×2 IMPLANT
KIT FILL ASEPTIC TRANSFER (MISCELLANEOUS) IMPLANT
MARKER SKIN DUAL TIP RULER LAB (MISCELLANEOUS) IMPLANT
NDL HYPO 25X1 1.5 SAFETY (NEEDLE) ×2 IMPLANT
NEEDLE HYPO 25X1 1.5 SAFETY (NEEDLE) ×2 IMPLANT
NS IRRIG 1000ML POUR BTL (IV SOLUTION) ×2 IMPLANT
PACK BASIN DAY SURGERY FS (CUSTOM PROCEDURE TRAY) ×2 IMPLANT
PENCIL SMOKE EVACUATOR (MISCELLANEOUS) ×2 IMPLANT
PIN SAFETY STERILE (MISCELLANEOUS) ×2 IMPLANT
PUNCH BIOPSY 4MM DISP (MISCELLANEOUS) IMPLANT
SHEET MEDIUM DRAPE 40X70 STRL (DRAPES) ×2 IMPLANT
SLEEVE SCD COMPRESS KNEE MED (STOCKING) ×2 IMPLANT
SPIKE FLUID TRANSFER (MISCELLANEOUS) IMPLANT
SPONGE T-LAP 18X18 ~~LOC~~+RFID (SPONGE) ×6 IMPLANT
STAPLER SKIN PROX WIDE 3.9 (STAPLE) ×2 IMPLANT
SUT CAPIO ETHIBPND (SUTURE) IMPLANT
SUT ETHILON 2 0 FS 18 (SUTURE) ×2 IMPLANT
SUT MNCRL AB 3-0 PS2 18 (SUTURE) IMPLANT
SUT MNCRL AB 4-0 PS2 18 (SUTURE) ×2 IMPLANT
SUT PDS AB 2-0 CT2 27 (SUTURE) IMPLANT
SUT PDS II 3-0 CT2 27 ABS (SUTURE) IMPLANT
SUT PROLENE 2 0 CT2 30 (SUTURE) IMPLANT
SUT SILK 2 0 SH (SUTURE) IMPLANT
SUT VIC AB 3-0 PS1 18XBRD (SUTURE) IMPLANT
SUT VIC AB 3-0 SH 27X BRD (SUTURE) IMPLANT
SUT VIC AB 4-0 PS2 18 (SUTURE) ×2 IMPLANT
SYR 50ML LL SCALE MARK (SYRINGE) IMPLANT
SYR BULB IRRIG 60ML STRL (SYRINGE) ×2 IMPLANT
SYR CONTROL 10ML LL (SYRINGE) ×2 IMPLANT
TOWEL GREEN STERILE FF (TOWEL DISPOSABLE) ×4 IMPLANT
TUBE CONNECTING 20X1/4 (TUBING) ×2 IMPLANT
UNDERPAD 30X36 HEAVY ABSORB (UNDERPADS AND DIAPERS) ×4 IMPLANT
YANKAUER SUCT BULB TIP NO VENT (SUCTIONS) ×2 IMPLANT

## 2024-01-21 NOTE — Discharge Instructions (Signed)

## 2024-01-21 NOTE — Transfer of Care (Signed)
 Immediate Anesthesia Transfer of Care Note  Patient: Taylor Holden  Procedure(s) Performed: Procedure(s) (LRB): RECONSTRUCTION, BREAST (Left) MAMMOPLASTY, REDUCTION (Right)  Patient Location: PACU  Anesthesia Type: General  Level of Consciousness: awake, oriented, sedated and patient cooperative  Airway & Oxygen Therapy: Patient Spontanous Breathing and Patient connected to face mask oxygen  Post-op Assessment: Report given to PACU RN and Post -op Vital signs reviewed and stable  Post vital signs: Reviewed and stable  Complications: No apparent anesthesia complications Last Vitals:  Vitals Value Taken Time  BP    Temp    Pulse 84 01/21/24 15:37  Resp    SpO2 91 % 01/21/24 15:37  Vitals shown include unfiled device data.  Last Pain:  Vitals:   01/21/24 1001  TempSrc: Tympanic  PainSc: 0-No pain      Patients Stated Pain Goal: 8 (01/21/24 1001)  Complications: No notable events documented.

## 2024-01-21 NOTE — Op Note (Signed)
 Operative Note   DATE OF OPERATION: 10.10.2025  LOCATION: [facility]-outpatient  SURGICAL DIVISION: Plastic Surgery  PREOPERATIVE DIAGNOSES: 1. Left breast DCIS 2. Macromastia   POSTOPERATIVE DIAGNOSES:  same  PROCEDURE:  Left oncoplastic breast reconstruction right breast reduction  SURGEON: Earlis Ranks MD MBA  ASSISTANT: none  ANESTHESIA:  General.   EBL: 100 ml  COMPLICATIONS: None immediate.   INDICATIONS FOR PROCEDURE:  The patient, Taylor Holden, is a 63 y.o. female born on [birth year], is here for staged breast reconstruction following left lumpectomy.   FINDINGS: Right reduction 721 g. Left reduction including resection lumpectomy cavity 581 g. Entirety of left lumpectomy cavity excised and sent as distinct specimen.   DESCRIPTION OF PROCEDURE:  The patient was marked standing in the preoperative area to mark sternal notch, chest midline, anterior axillary lines, inframammary folds. The location of new nipple areolar complex was marked at level of on inframammary fold on anterior surface breast by palpation. This was marked symmetric over bilateral breasts. With aid of Wise pattern marker, location of new nipple areolar complex and vertical limbs (8 cm) were marked by displacement of breasts along meridian. The patient was taken to the operating room. SCDs were placed and IV antibiotics were given. The patient's operative site was prepped and draped in a sterile fashion. A time out was performed and all information was confirmed to be correct.     I began on left breast. Over left breast, inferior pedicle marked and nipple areolar complex incised with 45 diameter marker. Pedicle deepithlialized and developed to chest wall. Pedicle developed to chest wall. Medial and lateral triangles of breast tissue resected over lower pole. Medial and lateral flaps developed.Over superior pole, entirety of lumpectomy cavity excised and sent as distinct specimen.  Additional lateral  breast tissue excised. Breast tailor tacked closed.   I then directed attention to right breast where superior medial pedicle designed. NAC incised with 45 diameter marker. The pedicle was deepithelialized. Pedicle developed to chest wall. Breast tissue resected over lower pole. Medial and lateral flaps developed. Additional superior and lateral breast tissue excised. Breast tailor tacked closed. Patient brought to upright sitting position and assessed for symmetry. Patient returned to supine position. Breast cavities irrigated and hemostasis obtained. Local anesthetic infiltrated throughout each breast. 15 Fr JP placed in each breast and secured with 2-0 nylon. Closure completed bilateral with interrupted and short running 3-0 vicryl used to approximate dermis along remainder inframammary fold and vertical limb. NAC inset with 3-0 vicryl in dermis. Skin closure completed with 4-0 monocryl subcuticular throughout vertical limbs and NAC. Skin closure completed with 3-0 monocryl along each IMF. Tissue adhesive applied. Dry dressing and breast binder applied.   The patient was allowed to wake from anesthesia, extubated and taken to the recovery room in satisfactory condition.   SPECIMENS: Right reduction Left reduction, left lumpectomy cavity   DRAINS: 15 Fr JP in right and left breast  Earlis Ranks, MD [facility] Plastic & Reconstructive Surgery  Office/ physician access line after hours [phone]

## 2024-01-21 NOTE — H&P (Signed)
 Subjective Patient ID: Taylor Holden is a 63 y.o. female.     HPI   Presents for staged breast reconstruction. Patient known to me as I have cared for her son. Presented following screening MMG with left breast calcifications. Diagnostic MMG showed 5 cm group of UPPER INNER LEFT breast calcifications. Biopsies labeled left upper inner anterior and left upper inner posterior both demonstrated intermediate grade DCIS, ER/PR+.   Patient has undergone left lumpectomy with final pathology at least 9 cm DCIS, margins negative but close, closes margin <77mm for medial lateral superior and inferior, others > 10mm.    Patient declined genetic testing.   Current 44 DD.  Wt stable   Retired Runner, broadcasting/film/video. Lives with spouse that accompanies her.   Review of Systems  Musculoskeletal:  Positive for arthralgias and joint swelling.  All other systems reviewed and are negative.     Objective Physical Exam  Cardiovascular: Normal rate. Normal heart sounds   Pulmonary/Chest Effort normal. Clear to auscultation   Skin   Fitzpatrick 2    Lymph: no palpable axillary adenopathy   Breasts: no palpable masses, grade 3 ptosis bilateral Right>left volume SN to nipple R 31.5 L 32.5 cm BW R 28 L 27 cm Nipple to IMF R 17 L 14 cm   Assessment/Plan Diagnoses and all orders for this visit:   Breast neoplasm, Tis (DCIS), left S/p left lumpectomy   Pathology has been reviewed with Dr. Vernetta. Plan proceed with staged oncoplastic reconstruction. Reviewed reduction with anchor type scars, drains, post operative visits and limitations, recovery. Diminished sensation nipple and breast skin, risk of nipple loss, wound healing problems, asymmetry. Discussed will have some contraction of breast volume and increased firmness with radiation, less ptosis with aging. This can result in asymmetries long term. Discussed changes with wt gain, loss, aging. Discussed lumpectomy alone can result in NAC displacement,  distortion contour breast following lumpectomy and RT, asymmetry breast volume and NAC position. Reviewed purpose of this type reconstruction to prevent these. Reviewed breast lift or trying to correct NAC displacement post RT more difficult. Counseled I cannot assure her cup size.  Reviewed can defer surgery until after therapies complete. In this setting would wait at least 6 months from end RT for any surgery. Reviewed increased risks complications in setting RT. Reviewed any complications from oncoplastic reconstruction procedure may delay start adjuvant therapies.     Additional risks including but not limited to bleeding, hematoma, seroma, infection, unacceptable cosmetic result, need for additional procedures, damage to adjacent structures, blood clots in legs or lungs reviewed.  Earlis Ranks, MD [facility] Plastic & Reconstructive Surgery  Office/ physician access line after hours [phone]

## 2024-01-21 NOTE — Anesthesia Preprocedure Evaluation (Addendum)
 Anesthesia Evaluation  Patient identified by MRN, date of birth, ID band Patient awake    Reviewed: Allergy & Precautions, NPO status , Patient's Chart, lab work & pertinent test results, reviewed documented beta blocker date and time   History of Anesthesia Complications (+) PONV and history of anesthetic complications  Airway Mallampati: II  TM Distance: >3 FB Neck ROM: Full    Dental no notable dental hx. (+) Dental Advisory Given, Teeth Intact   Pulmonary neg pulmonary ROS, neg recent URI   Pulmonary exam normal breath sounds clear to auscultation       Cardiovascular hypertension, Pt. on home beta blockers Normal cardiovascular exam Rhythm:Regular Rate:Normal  HLD   Neuro/Psych negative neurological ROS  negative psych ROS   GI/Hepatic negative GI ROS, Neg liver ROS,,,  Endo/Other  negative endocrine ROS    Renal/GU negative Renal ROS     Musculoskeletal  (+) Arthritis ,    Abdominal   Peds  Hematology negative hematology ROS (+)   Anesthesia Other Findings   Reproductive/Obstetrics                              Anesthesia Physical Anesthesia Plan  ASA: 2  Anesthesia Plan: General   Post-op Pain Management: Tylenol PO (pre-op)*, Celebrex PO (pre-op)* and Gabapentin PO (pre-op)*   Induction: Intravenous  PONV Risk Score and Plan: 3 and Ondansetron  and Midazolam   Airway Management Planned: Oral ETT  Additional Equipment:   Intra-op Plan:   Post-operative Plan: Extubation in OR  Informed Consent: I have reviewed the patients History and Physical, chart, labs and discussed the procedure including the risks, benefits and alternatives for the proposed anesthesia with the patient or authorized representative who has indicated his/her understanding and acceptance.     Dental advisory given  Plan Discussed with: CRNA  Anesthesia Plan Comments:          Anesthesia  Quick Evaluation

## 2024-01-21 NOTE — Anesthesia Procedure Notes (Signed)
 Procedure Name: Intubation Date/Time: 01/21/2024 11:53 AM  Performed by: Frost Kayla MATSU, CRNAPre-anesthesia Checklist: Patient identified, Emergency Drugs available, Suction available and Patient being monitored Patient Re-evaluated:Patient Re-evaluated prior to induction Oxygen Delivery Method: Circle system utilized Preoxygenation: Pre-oxygenation with 100% oxygen Induction Type: IV induction Ventilation: Mask ventilation without difficulty Laryngoscope Size: Miller and 3 Grade View: Grade I Tube type: Oral Tube size: 7.0 mm Number of attempts: 1 Airway Equipment and Method: Stylet and Oral airway Placement Confirmation: ETT inserted through vocal cords under direct vision, positive ETCO2 and breath sounds checked- equal and bilateral Secured at: 20 cm Tube secured with: Tape Dental Injury: Teeth and Oropharynx as per pre-operative assessment

## 2024-01-22 NOTE — Anesthesia Postprocedure Evaluation (Signed)
 Anesthesia Post Note  Patient: Taylor Holden  Procedure(s) Performed: RECONSTRUCTION, BREAST (Left: Breast) MAMMOPLASTY, REDUCTION (Right: Breast)     Patient location during evaluation: PACU Anesthesia Type: General Level of consciousness: sedated and patient cooperative Pain management: pain level controlled Vital Signs Assessment: post-procedure vital signs reviewed and stable Respiratory status: spontaneous breathing Cardiovascular status: stable Anesthetic complications: no   No notable events documented.  Last Vitals:  Vitals:   01/21/24 1710 01/21/24 1727  BP: (!) 142/68   Pulse: 69   Resp: 18   Temp: (!) 36.4 C   SpO2: 93% 92%    Last Pain:  Vitals:   01/21/24 1710  TempSrc:   PainSc: 5                  Norleen Pope

## 2024-01-23 ENCOUNTER — Encounter (HOSPITAL_BASED_OUTPATIENT_CLINIC_OR_DEPARTMENT_OTHER): Payer: Self-pay | Admitting: Plastic Surgery

## 2024-01-24 NOTE — Telephone Encounter (Signed)
 Post op call made to patient. Patient 2d post right breast reduction and left breast oncoplastic reconstruction. Patient able to take a shower and denies having any fevers, SOB, increased redness or swelling. Patient managing pain with tylenol. Patient has two drains in and reports the following totals:  10/10 0745  L - 30 mL R - 20 mL  10/11 0930 2100 L - 35 mL L - 20 mL R - 30 mL R - 20 mL  10/12 1000 2245 L - 15 mL L - 10 mL R - 20 mL R - 20 mL  Patient continuing to strip tubes and states bulbs remaining flat. Patient asking if she may get a her flu vaccine and if she should continue to wear a mask. Patient states she is continuing cancer treatment in November. Patient informed from a surgery standpoint she may get her flu vaccine but should check with the oncologist office also. Patient informed continuing to wear her mask in public will protect her during flu/cold season. Patient had no further questions and reminded to bring drain log to post op appointment. Patient verbalized understanding.

## 2024-01-25 LAB — SURGICAL PATHOLOGY

## 2024-01-27 ENCOUNTER — Encounter: Payer: Self-pay | Admitting: *Deleted

## 2024-02-07 NOTE — Progress Notes (Signed)
 Location of Breast Cancer: left breast  Histology per Pathology Report:    Receptor Status: ER(pos), PR (pos)  Did patient present with symptoms (if so, please note symptoms) or was this found on screening mammography?: Found on screening mammogram   Past/Anticipated interventions by surgeon, if any:  RADIOACTIVE SEED GUIDED LEFT BREAST BRACKETED LUMPECTOMY on 01/12/24  Past/Anticipated interventions by medical oncology, if any: None  Lymphedema issues, if any:  None  Pain issues, if any:  None  Skin issues if any : None  SAFETY ISSUES: Prior radiation? no Pacemaker/ICD? no Possible current pregnancy? No, postmenopausal Is the patient on methotrexate? no  Current Complaints / other details:  None    Dyke JULIANNA Frost, LPN 89/72/7974,87:44 PM

## 2024-02-11 NOTE — Progress Notes (Signed)
 Chief Complaint  Patient presents with  . Post Operative Visit    1ST POST-OP-DB-CDS-OP-LT BR RSL(BCG) BRKTD LUMP-10/1     Subjective  Taylor Holden is a 63 y.o. female who presents for Post Operative Visit (1ST POST-OP-DB-CDS-OP-LT BR RSL(BCG) BRKTD LUMP-10/1/) HPI History of Present Illness Taylor Holden is a 63 year old female who presents for post-operative follow-up after breast reduction surgery.  Her initial lumpectomy of the left breast showed negative but close margins.  After the final reduction, the margins were negative except for focal ADH  She underwent breast reduction surgery and is currently in the post-operative phase. She feels 'like a new person' and is pleased with the results. The drains have been removed, and she is scheduled to see her oncologist on Thursday, with a simulation appointment on Friday.  She discusses the surgical process, mentioning that the second pathology report indicated that the ductal carcinoma in situ (DCIS) was at the margin. She describes the impact of the surgery on her daily life, noting that she no longer feels heavy and that her clothing fits better. Prior to the surgery, her breasts were large and caused discomfort, especially when lying down. She is now able to wear different types of clothing, including swimsuits, without embarrassment.  She expresses some fear about upcoming radiation therapy. She is scheduled to begin radiation treatment soon and is aware of the follow-up care required, including regular mammograms and ultrasounds if needed.  Review of Systems  Patient Active Problem List  Diagnosis  . Fatigue  . DJD (degenerative joint disease)  . Ductal carcinoma in situ (DCIS) of left breast  . Hypertensive disorder  . Glaucoma  . Hyperlipidemia  . Nonspecific abnormal electrocardiogram (ECG) (EKG)  . Nonspecific elevation of levels of transaminase or lactic acid dehydrogenase (LDH)  . Obesity (BMI 30.0-34.9)  . Personal history  of urinary calculi  . Postmenopausal syndrome  . Snoring  . Vitamin D  deficiency  . Annual physical exam    Outpatient Medications Prior to Visit  Medication Sig Dispense Refill  . acetaminophen (TYLENOL) 500 MG tablet Take 1,000 mg by mouth every 8 (eight) hours as needed for Pain (Headache)    . calcium carbonate-vitamin D3 500 mg-10 mcg (400 unit) Chew Take by mouth    . losartan  (COZAAR ) 100 MG tablet Take 100 mg by mouth once daily    . metoprolol  SUCCinate (TOPROL -XL) 50 MG XL tablet Take 50 mg by mouth once daily Take with or immediately following a meal    . CELEBREX 200 mg capsule Take 200 mg by mouth 2 (two) times daily as needed (Patient not taking: Reported on 02/11/2024)    . cholecalciferol (VITAMIN D3) 1000 unit capsule Take 1,000 Units by mouth once daily (Patient not taking: Reported on 02/11/2024)    . latanoprost (XALATAN) 0.005 % ophthalmic solution Place 1 drop into both eyes at bedtime (Patient not taking: Reported on 02/11/2024)     No facility-administered medications prior to visit.      Objective  Vitals:   02/11/24 1122 02/11/24 1124  Pulse: 95   Resp: 16   Temp: 36.8 C (98.2 F)   SpO2: 98%   Weight: 93.2 kg (205 lb 6.4 oz)   Height: 161.3 cm (5' 3.5)   PainSc:  0-No pain   Body mass index is 35.81 kg/m.  Home Vitals:     Physical Exam Physical Exam  Her incisions for the reduction are healing well    Results PATHOLOGY DCIS at the  margin     Assessment/Plan:   Assessment & Plan Ductal carcinoma in situ (DCIS) of left breast Postoperative recovery from breast reduction surgery is progressing well. Pathology showed DCIS margin are negative but no invasive cancer, reducing recurrence risk. She is satisfied with cosmetic results and understands the need for radiation therapy.  The area of the DCIS margin is the chest wall so no other re-excision is needed.  - Schedule follow-up in six months. - Proceed with radiation therapy. -  Instruct to report new breast changes immediately. - Provided reassurance about radiation therapy. Diagnoses and all orders for this visit:  Postop check    An after visit summary was provided for the patient either in written format (printed) or through [facility].  This note has been created using automated tools and reviewed for accuracy by VICENTA DASIE POLI.

## 2024-02-14 NOTE — Progress Notes (Signed)
 Radiation Oncology         [phone] ________________________________  Name: Taylor Holden        MRN: [id number]  Date of Service: 02/17/2024 DOB: 1961/02/01  CC:[name], [name], MD  Loretha Ash, MD     REFERRING PHYSICIAN: Loretha Ash, MD   DIAGNOSIS: The encounter diagnosis was Ductal carcinoma in situ (DCIS) of left breast.   HISTORY OF PRESENT ILLNESS: Taylor Holden is a 63 y.o. female originally seen in the multidisciplinary breast clinic for a new diagnosis of left breast cancer. The patient was noted to have left breast calcifications on a recent screening mammogram.  She returned for diagnostic workup on 12/21/2023 in the left group of calcifications were measured at 5 cm in greatest dimension in the upper inner left breast.  She underwent stereotactic biopsy on 12/23/2023 which showed intermediate grade DCIS of the anterior and posterior specimens obtained.  Necrosis and calcifications were present in the anterior specimen, and calcifications were present in the posterior specimen.  Her cancer was ER/PR positive.   Since the patient's last visit, she underwent a left lumpectomy on 01/12/2024.  This showed intermediate grade DCIS with comedonecrosis, the cancer was present at both medial and lateral margins and the overall span of the disease was approximately 9 cm.  She returned for reexcision of her margins on 01/21/2024 as well as for oncoplastic mammoplasty.  Her lumpectomy specimen showed focal residual intermediate grade DCIS measuring 2 mm in greatest dimension, her margins were negative the closest though was the inked black margin though this was not anatomically annotated.  However in a later note from Dr. Arelia but it was described as being the posterior margin of the chest wall with no plans for further resection.  Her mammoplasty specimen from that breast showed benign breast tissue with fibrocystic change, the right breast mammoplasty specimen showed benign breast  tissue.  She is seen to consider adjuvant radiotherapy.    PREVIOUS RADIATION THERAPY: No   PAST MEDICAL HISTORY:  Past Medical History:  Diagnosis Date   Adrenal adenoma    Allergy    Aortic atherosclerosis    Arthritis    Cancer (HCC) 12/2023   left breast DCIS   Complication of anesthesia    Early menopause    Fatty liver    Glaucoma    History of kidney stones    Hyperlipidemia    Hypertension    PONV (postoperative nausea and vomiting)    nauseated but no emesis with lumpectomy   Vitamin D  deficiency        PAST SURGICAL HISTORY: Past Surgical History:  Procedure Laterality Date   BREAST BIOPSY Left 12/23/2023   MM LT BREAST BX W LOC DEV EA AD LESION IMG BX SPEC STEREO GUIDE 12/23/2023 [facility]   BREAST BIOPSY Left 12/23/2023   MM LT BREAST BX W LOC DEV 1ST LESION IMAGE BX SPEC STEREO GUIDE 12/23/2023 [facility]   BREAST BIOPSY  01/11/2024   MM LT RADIOACTIVE SEED LOC MAMMO GUIDE 01/11/2024 [facility]   BREAST BIOPSY  01/11/2024   MM LT RADIOACTIVE SEED EA ADD LESION LOC MAMMO GUIDE 01/11/2024 [facility]   BREAST BIOPSY  01/11/2024   MM LT RADIOACTIVE SEED EA ADD LESION LOC MAMMO GUIDE 01/11/2024 [facility]   BREAST LUMPECTOMY WITH RADIOACTIVE SEED LOCALIZATION Left 01/12/2024   Procedure: RADIOACTIVE SEED GUIDED LEFT BREAST BRACKETED LUMPECTOMY;  Surgeon: Vernetta Berg, MD;  Location: [facility];  Service: General;  Laterality: Left;  LEFT BREAST RADIOACTIVE SEED GUIDED BRACKETED LUMPECTOMY   BREAST RECONSTRUCTION Left 01/21/2024   Procedure: RECONSTRUCTION, BREAST;  Surgeon: Arelia Filippo, MD;  Location: [facility];  Service: Plastics;  Laterality: Left;  LEFT BREAST ONCOPLASTIC RECONSTRUCTION   BREAST REDUCTION SURGERY Right 01/21/2024   Procedure: MAMMOPLASTY, REDUCTION;  Surgeon: Arelia Filippo, MD;  Location: [facility];  Service: Plastics;  Laterality: Right;  RIGHT BREAST  REDUCTION   CATARACT EXTRACTION, BILATERAL Bilateral 07/2020   CESAREAN SECTION  04/1998   x1   CHOLECYSTECTOMY  08/1998   EYE SURGERY Right 01/2014   for glaucoma   GLAUCOMA SURGERY Bilateral 03/2020   TOTAL HIP ARTHROPLASTY Right 05/15/2022   WISDOM TOOTH EXTRACTION       FAMILY HISTORY:  Family History  Problem Relation Age of Onset   Colon polyps Mother    Alzheimer's disease Mother    Diabetes Father    Hypertension Father    Stroke Father        aneurysm    Diabetes Paternal Grandmother    Heart disease Paternal Grandmother        ~ 25 y/o   Hypertension Paternal Grandmother    Melanoma Maternal Grandmother 28   Colon cancer Other    Leukemia Paternal Cousin 40   Breast cancer Neg Hx    Stomach cancer Neg Hx    Esophageal cancer Neg Hx    Pancreatic cancer Neg Hx    Rectal cancer Neg Hx      SOCIAL HISTORY:  reports that she has never smoked. She has never used smokeless tobacco. She reports that she does not drink alcohol and does not use drugs. The patient is married and lives in [place]. 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 She has a 8 year old son.    ALLERGIES: Cephalexin, Penicillins, Flagyl [metronidazole], Latex, Prednisone, and Ergocalciferol    MEDICATIONS:  Current Outpatient Medications  Medication Sig Dispense Refill   acetaminophen (TYLENOL) 500 MG tablet Take 1,000 mg by mouth every 8 (eight) hours as needed for mild pain or headache.     CELEBREX 200 MG capsule Take 200 mg by mouth 2 (two) times daily. As needed     Cholecalciferol (VITAMIN D3) 25 MCG (1000 UT) CAPS Take 1 capsule (1,000 Units total) by mouth daily.     latanoprost (XALATAN) 0.005 % ophthalmic solution Place 1 drop into both eyes at bedtime.     losartan  (COZAAR ) 100 MG tablet Take 1 tablet (100 mg total) by mouth daily. 90 tablet 1   metoprolol  succinate (TOPROL -XL) 50 MG 24 hr tablet Take 1 tablet (50 mg total) by mouth daily. Take with or immediately following a  meal 90 tablet 1   Multiple Vitamins-Minerals (CENTRUM SILVER 50+WOMEN PO) Take by mouth.     traMADol (ULTRAM) 50 MG tablet Take 1 tablet (50 mg total) by mouth every 6 (six) hours as needed for moderate pain (pain score 4-6) or severe pain (pain score 7-10). 15 tablet 0   No current facility-administered medications for this visit.     REVIEW OF SYSTEMS: On review of systems, the patient reports that she is doing ***     PHYSICAL EXAM:  Wt Readings from Last 3 Encounters:  01/21/24 207 lb 0.2 oz (93.9 kg)  01/12/24 210 lb 8.6 oz (95.5 kg)  12/29/23 209 lb 11.2 oz (95.1 kg)   Temp Readings from Last 3 Encounters:  01/21/24 (!) 97.5 F (36.4 C)  01/12/24 ROLLEN)  97.3 F (36.3 C)  12/29/23 97.7 F (36.5 C) (Temporal)   BP Readings from Last 3 Encounters:  01/21/24 (!) 142/68  01/12/24 128/76  12/29/23 (!) 149/55   Pulse Readings from Last 3 Encounters:  01/21/24 69  01/12/24 77  12/29/23 80    In general this is a well appearing caucasian  female in no acute distress. She's alert and oriented x4 and appropriate throughout the examination. Cardiopulmonary assessment is negative for acute distress and she exhibits normal effort.  Her left breast incision is healing well as is her right scar line from her mammoplasty.  Neither show evidence of separation, erythema or drainage.    ECOG = ***  0 - Asymptomatic (Fully active, able to carry on all predisease activities without restriction)  1 - Symptomatic but completely ambulatory (Restricted in physically strenuous activity but ambulatory and able to carry out work of a light or sedentary nature. For example, light housework, office work)  2 - Symptomatic, <50% in bed during the day (Ambulatory and capable of all self care but unable to carry out any work activities. Up and about more than 50% of waking hours)  3 - Symptomatic, >50% in bed, but not bedbound (Capable of only limited self-care, confined to bed or chair 50% or  more of waking hours)  4 - Bedbound (Completely disabled. Cannot carry on any self-care. Totally confined to bed or chair)  5 - Death   Raylene MM, Creech RH, Tormey DC, et al. [phone]). Toxicity and response criteria of the [facility]. Am. DOROTHA Bridges. Oncol. 5 (6): 649-55    LABORATORY DATA:  Lab Results  Component Value Date   WBC 7.5 12/29/2023   HGB 14.3 12/29/2023   HCT 43.0 12/29/2023   MCV 85.0 12/29/2023   PLT 255 12/29/2023   Lab Results  Component Value Date   NA 140 12/29/2023   K 4.1 12/29/2023   CL 105 12/29/2023   CO2 29 12/29/2023   Lab Results  Component Value Date   ALT 27 12/29/2023   AST 22 12/29/2023   ALKPHOS 100 12/29/2023   BILITOT 0.7 12/29/2023      RADIOGRAPHY: No results found.      IMPRESSION/PLAN: 1. Intermediate grade, ER/PR positive  DCIS of the left breast. Dr. Dewey has reviewed her final pathology results, her margin remains close and what sounds like the posterior position.  Dr. Vernetta does not recommend additional surgery based on notes from Dr. Arelia. Dr. Dewey recommends external radiotherapy to the breast  to reduce risks of local recurrence. Dr. Loretha anticipates adjuvant antiestrogen therapy to follow. We discussed the risks, benefits, short, and long term effects of radiotherapy, as well as the curative intent, and the patient is interested in proceeding.  I reviewed the delivery and logistics of radiotherapy and Dr. Smitty recommendation of 4 weeks of radiotherapy to the left breast with deep inspiration breath-hold technique. Written consent is obtained and placed in the chart, a copy was provided to the patient. She will simulate on Friday***  In a visit lasting *** minutes, greater than 50% of the time was spent face to face reviewing her case, as well as in preparation of, discussing, and coordinating the patient's care.      Donald KYM Husband, [facility]    **Disclaimer: This note was dictated with  voice recognition software. Similar sounding words can inadvertently be transcribed and this note may contain transcription errors which may not have been corrected upon publication  of note.**

## 2024-02-15 NOTE — Progress Notes (Signed)
 Subjective Patient ID: Taylor Holden is a 63 y.o. female.   HPI  3.5 w post op. Seen by Dr. Vernetta last week and expressed gratitude for reduction surgery.  Presented following screening mammogram with left breast calcifications. Diagnostic MMG showed 5 cm group of UPPER INNER LEFT breast calcifications. Biopsies labeled left upper inner anterior and left upper inner posterior both demonstrated intermediate grade DCIS, ER/PR+.  Patient underwent left lumpectomy with final pathology at least 9 cm DCIS, margins negative but close, closes margin <89mm for medial lateral superior and inferior, others > 10mm.   Right reduction 721 g. Left reduction including resection lumpectomy cavity 581 g. Entirety of left lumpectomy cavity excised and sent as distinct specimen. Final pathology from this latter specimen 2 mm residual DCIS margins negative but close 1mm from surgical margin (inked black). ADH focally extends to surgical margin.     Patient declined genetic testing.  Prior 44 DD.    Retired runner, broadcasting/film/video. Lives with spouse that accompanies her.  Review of Systems  Objective Physical Exam   Breasts: incisions intact with excepetion few mm open area R t junction and 3 x 1.2 x 0.1 cm dry wound 90 % granulated left T junction Symmetric shape and contour  Excised multiple left UOQ breast/axilla and one upper medial arm skin tags, treated with silver nitrate tolerated well.  Assessment/Plan  Breast neoplasm, Tis (DCIS), left s/p left lumpectomy S/p left oncoplastic reconstruction (with re excision lumpectomy cavity), right breast reduction   Pictures today. Increase activities as tolerated with exception no swimming until above healed. No underwire x 6 w post op otherwise bra as desired. Reviewed changes with RT including loss volume. Ok to proceed with RT from economist.  Appts with Rad Onc scheduled 11.6.25 and 11.7.25  F/u March 2026

## 2024-02-17 ENCOUNTER — Ambulatory Visit
Admission: RE | Admit: 2024-02-17 | Discharge: 2024-02-17 | Disposition: A | Discharge status: Home / Self Care | Source: Ambulatory Visit | Attending: Radiation Oncology | Admitting: Radiation Oncology

## 2024-02-17 ENCOUNTER — Encounter: Payer: Self-pay | Admitting: Radiation Oncology

## 2024-02-17 ENCOUNTER — Ambulatory Visit
Admission: RE | Admit: 2024-02-17 | Discharge: 2024-02-17 | Attending: Radiation Oncology | Admitting: Radiation Oncology

## 2024-02-17 VITALS — BP 167/85 | HR 72 | Temp 97.3°F | Resp 18 | Ht 64.0 in | Wt 206.0 lb

## 2024-02-17 DIAGNOSIS — Z808 Family history of malignant neoplasm of other organs or systems: Secondary | ICD-10-CM | POA: Diagnosis not present

## 2024-02-17 DIAGNOSIS — E785 Hyperlipidemia, unspecified: Secondary | ICD-10-CM | POA: Insufficient documentation

## 2024-02-17 DIAGNOSIS — Z170 Estrogen receptor positive status [ER+]: Secondary | ICD-10-CM | POA: Diagnosis not present

## 2024-02-17 DIAGNOSIS — Z800 Family history of malignant neoplasm of digestive organs: Secondary | ICD-10-CM | POA: Insufficient documentation

## 2024-02-17 DIAGNOSIS — Z1721 Progesterone receptor positive status: Secondary | ICD-10-CM | POA: Insufficient documentation

## 2024-02-17 DIAGNOSIS — Z806 Family history of leukemia: Secondary | ICD-10-CM | POA: Diagnosis not present

## 2024-02-17 DIAGNOSIS — Z87442 Personal history of urinary calculi: Secondary | ICD-10-CM | POA: Diagnosis not present

## 2024-02-17 DIAGNOSIS — M129 Arthropathy, unspecified: Secondary | ICD-10-CM | POA: Insufficient documentation

## 2024-02-17 DIAGNOSIS — D0512 Intraductal carcinoma in situ of left breast: Secondary | ICD-10-CM | POA: Insufficient documentation

## 2024-02-17 DIAGNOSIS — Z86018 Personal history of other benign neoplasm: Secondary | ICD-10-CM | POA: Insufficient documentation

## 2024-02-17 DIAGNOSIS — I700 Atherosclerosis of aorta: Secondary | ICD-10-CM | POA: Diagnosis not present

## 2024-02-17 DIAGNOSIS — Z79899 Other long term (current) drug therapy: Secondary | ICD-10-CM | POA: Insufficient documentation

## 2024-02-17 DIAGNOSIS — I10 Essential (primary) hypertension: Secondary | ICD-10-CM | POA: Insufficient documentation

## 2024-02-17 DIAGNOSIS — E559 Vitamin D deficiency, unspecified: Secondary | ICD-10-CM | POA: Insufficient documentation

## 2024-02-18 ENCOUNTER — Ambulatory Visit: Admitting: Radiation Oncology

## 2024-02-24 ENCOUNTER — Encounter: Payer: Self-pay | Admitting: Radiation Oncology

## 2024-02-25 ENCOUNTER — Ambulatory Visit: Admitting: Radiation Oncology

## 2024-02-25 ENCOUNTER — Telehealth: Payer: Self-pay | Admitting: Hematology and Oncology

## 2024-02-25 ENCOUNTER — Ambulatory Visit: Admitting: Internal Medicine

## 2024-02-25 ENCOUNTER — Encounter: Payer: Self-pay | Admitting: *Deleted

## 2024-02-25 DIAGNOSIS — D0512 Intraductal carcinoma in situ of left breast: Secondary | ICD-10-CM

## 2024-02-25 NOTE — Telephone Encounter (Signed)
 I spoke with patient regarding appointment on 04/12/2024. Patient is aware of date/time for post xrt follow up.

## 2024-03-02 ENCOUNTER — Encounter: Payer: Self-pay | Admitting: Radiation Oncology

## 2024-03-13 ENCOUNTER — Ambulatory Visit: Admitting: Radiation Oncology

## 2024-03-13 ENCOUNTER — Encounter: Payer: Self-pay | Admitting: Radiation Oncology

## 2024-03-14 ENCOUNTER — Ambulatory Visit

## 2024-03-15 ENCOUNTER — Ambulatory Visit

## 2024-03-16 ENCOUNTER — Ambulatory Visit

## 2024-03-17 ENCOUNTER — Ambulatory Visit

## 2024-03-20 ENCOUNTER — Ambulatory Visit

## 2024-03-21 ENCOUNTER — Ambulatory Visit

## 2024-03-22 ENCOUNTER — Ambulatory Visit

## 2024-03-23 ENCOUNTER — Ambulatory Visit

## 2024-03-24 ENCOUNTER — Ambulatory Visit

## 2024-03-27 ENCOUNTER — Ambulatory Visit

## 2024-03-28 ENCOUNTER — Ambulatory Visit

## 2024-03-29 ENCOUNTER — Ambulatory Visit

## 2024-03-30 ENCOUNTER — Ambulatory Visit

## 2024-03-31 ENCOUNTER — Ambulatory Visit: Admitting: Radiation Oncology

## 2024-03-31 ENCOUNTER — Ambulatory Visit

## 2024-04-03 ENCOUNTER — Ambulatory Visit

## 2024-04-04 ENCOUNTER — Ambulatory Visit

## 2024-04-05 ENCOUNTER — Ambulatory Visit

## 2024-04-10 ENCOUNTER — Ambulatory Visit

## 2024-04-11 ENCOUNTER — Ambulatory Visit

## 2024-04-12 ENCOUNTER — Inpatient Hospital Stay: Admitting: Hematology and Oncology

## 2024-04-12 ENCOUNTER — Inpatient Hospital Stay: Attending: Hematology and Oncology | Admitting: Hematology and Oncology

## 2024-04-12 VITALS — BP 148/61 | HR 90 | Temp 97.4°F | Resp 18 | Ht 64.0 in | Wt 209.0 lb

## 2024-04-12 DIAGNOSIS — D0512 Intraductal carcinoma in situ of left breast: Secondary | ICD-10-CM | POA: Insufficient documentation

## 2024-04-12 DIAGNOSIS — Z79811 Long term (current) use of aromatase inhibitors: Secondary | ICD-10-CM | POA: Diagnosis not present

## 2024-04-12 MED ORDER — ANASTROZOLE 1 MG PO TABS: 1.0000 mg | ORAL_TABLET | Freq: Every day | ORAL | 3 refills | Status: AC

## 2024-04-12 NOTE — Progress Notes (Signed)
 [facility] CONSULT NOTE  Patient Care Team: Amon [name], MD as PCP - General (Internal Medicine) Leslee Reusing, MD as Consulting Physician (Ophthalmology) Marget Lenis, MD as Consulting Physician (Obstetrics and Gynecology) Mansouraty, Aloha Raddle., MD as Consulting Physician (Gastroenterology) Tyree Nanetta SAILOR, RN as Oncology Nurse Navigator Gerome, Devere HERO, RN as Oncology Nurse Navigator Vernetta Berg, MD as Consulting Physician (General Surgery) Loretha Ash, MD as Consulting Physician (Hematology and Oncology) Dewey Rush, MD as Consulting Physician (Radiation Oncology)  CHIEF COMPLAINTS/PURPOSE OF CONSULTATION:  DCIS  ASSESSMENT & PLAN:   Assessment and Plan Assessment & Plan 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 Undergoing adjuvant radiation therapy. Eligible for aromatase inhibitor therapy post-radiation. Prefers aromatase inhibitors due to thrombotic risk concerns with tamoxifen. Anticipated side effects include menopausal symptoms and musculoskeletal complaints. Long-term risk of osteoporosis due to estrogen deprivation. Five years of therapy recommended.  - Initiate aromatase inhibitor therapy (anastrozole or letrozole) post-radiation and wound healing, targeting March 1st start date - Provided guidance on menopausal symptoms, joint stiffness, and vaginal dryness, noting potential improvement after 3-6 months. - Advised bone health monitoring, weight-bearing exercise, vitamin D , and calcium supplementation. - Scheduled follow-up for June to assess tolerability and side effects, with subsequent visits once or twice yearly if well tolerated. - Instructed to contact office if side effects are intolerable   HISTORY OF PRESENTING ILLNESS:  Taylor Holden 63 y.o. female is here because of  DCIS  Discussed the use of AI scribe software for clinical note transcription with the patient, who gave verbal consent to proceed.  History of Present Illness   Taylor Holden is a 63 year old female with malignant neoplasm of the left breast who presents for oncology follow-up and discussion of adjuvant endocrine therapy initiation.  She is currently undergoing adjuvant radiation therapy to the left breast, which began on December 19th following delayed wound healing at the surgical site. The wound required approximately four weeks to resolve and is now nearly healed, with only a minimal area remaining. She anticipates complete healing by January. Radiation therapy is ongoing, with an expected completion date of February 13th.  She expresses some apprehension regarding radiation but feels reassured after reviewing educational materials. She understands that the treated breast may have persistent changes in sensation.  She is preparing to initiate adjuvant endocrine therapy with an aromatase inhibitor after completion of radiation and full wound healing. She prefers an aromatase inhibitor due to concerns about thromboembolic risk. She inquires about timing and administration and is advised to begin therapy around March 1st. She is informed that the medication can be taken with her morning antihypertensive and vitamin D , and does not require coordination with meals. She does not take thyroid  medication.  All other systems were reviewed with the patient and are negative.  MEDICAL HISTORY:  Past Medical History:  Diagnosis Date   Adrenal adenoma    Allergy    Aortic atherosclerosis    Arthritis    Cancer (HCC) 12/2023   left breast DCIS   Complication of anesthesia    Early menopause    Fatty liver    Glaucoma    History of kidney stones    Hyperlipidemia    Hypertension    PONV (postoperative nausea and vomiting)    nauseated but no emesis with lumpectomy   Vitamin D  deficiency  SURGICAL HISTORY: Past Surgical History:  Procedure Laterality Date   BREAST BIOPSY Left 12/23/2023   MM LT BREAST BX W LOC DEV EA AD LESION IMG BX SPEC STEREO GUIDE 12/23/2023 [facility]   BREAST BIOPSY Left 12/23/2023   MM LT BREAST BX W LOC DEV 1ST LESION IMAGE BX SPEC STEREO GUIDE 12/23/2023 [facility]   BREAST BIOPSY  01/11/2024   MM LT RADIOACTIVE SEED LOC MAMMO GUIDE 01/11/2024 [facility]   BREAST BIOPSY  01/11/2024   MM LT RADIOACTIVE SEED EA ADD LESION LOC MAMMO GUIDE 01/11/2024 [facility]   BREAST BIOPSY  01/11/2024   MM LT RADIOACTIVE SEED EA ADD LESION LOC MAMMO GUIDE 01/11/2024 [facility]   BREAST LUMPECTOMY WITH RADIOACTIVE SEED LOCALIZATION Left 01/12/2024   Procedure: RADIOACTIVE SEED GUIDED LEFT BREAST BRACKETED LUMPECTOMY;  Surgeon: Vernetta Berg, MD;  Location: [facility];  Service: General;  Laterality: Left;  LEFT BREAST RADIOACTIVE SEED GUIDED BRACKETED LUMPECTOMY   BREAST RECONSTRUCTION Left 01/21/2024   Procedure: RECONSTRUCTION, BREAST;  Surgeon: Arelia Filippo, MD;  Location: [facility];  Service: Plastics;  Laterality: Left;  LEFT BREAST ONCOPLASTIC RECONSTRUCTION   BREAST REDUCTION SURGERY Right 01/21/2024   Procedure: MAMMOPLASTY, REDUCTION;  Surgeon: Arelia Filippo, MD;  Location: [facility];  Service: Plastics;  Laterality: Right;  RIGHT BREAST REDUCTION   CATARACT EXTRACTION, BILATERAL Bilateral 07/2020   CESAREAN SECTION  04/1998   x1   CHOLECYSTECTOMY  08/1998   EYE SURGERY Right 01/2014   for glaucoma   GLAUCOMA SURGERY Bilateral 03/2020   TOTAL HIP ARTHROPLASTY Right 05/15/2022   WISDOM TOOTH EXTRACTION      SOCIAL HISTORY: Social History   Socioeconomic History   Marital status: Married    Spouse name: Not on file   Number of children: 1   Years of education: Not on file   Highest education level: Not on file  Occupational History   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, site , retiring 2019  Tobacco Use   Smoking status: Never   Smokeless tobacco: Never  Vaping Use   Vaping status: Never Used  Substance and Sexual Activity   Alcohol use: Never    Alcohol/week: 0.0 standard drinks of alcohol   Drug use: No   Sexual activity: Not Currently    Partners: Male    Birth control/protection: Post-menopausal  Other Topics Concern   Not on file  Social History Narrative   Household: pt, husband , son   Son 2000, [employer]: graduated;  dx w/ Crohn's Dz 04-2019   Social Drivers of Health   Tobacco Use: Low Risk (02/16/2024)   Received from [facility]   Patient History    Smoking Tobacco Use: Never    Smokeless Tobacco Use: Never    Passive Exposure: Never  Financial Resource Strain: Not on file  Food Insecurity: No Food Insecurity (12/29/2023)   Epic    Worried About Programme Researcher, Broadcasting/film/video in the Last Year: Never true    Ran Out of Food in the Last Year: Never true  Transportation Needs: No Transportation Needs (12/29/2023)   Epic    Lack of Transportation (Medical): No    Lack of Transportation (Non-Medical): No  Physical Activity: Not on file  Stress: Not on file  Social Connections: Not on file  Intimate Partner Violence: Not At Risk (12/29/2023)   Epic    Fear of Current or Ex-Partner: No    Emotionally Abused: No    Physically Abused: No  Sexually Abused: No  Depression (PHQ2-9): Low Risk (12/29/2023)   Depression (PHQ2-9)    PHQ-2 Score: 0  Alcohol Screen: Not on file  Housing: Unknown (02/11/2024)   Received from [facility]   Epic    Unable to Pay for Housing in the Last Year: Not on file    Number of Times Moved in the Last Year: Not on file    At any time in the past 12 months, were you homeless or living in a shelter (including now)?: No  Utilities: Not At Risk (12/29/2023)   Epic    Threatened with loss of utilities: No  Health Literacy: Not on file    FAMILY HISTORY: Family History   Problem Relation Age of Onset   Colon polyps Mother    Alzheimer's disease Mother    Diabetes Father    Hypertension Father    Stroke Father        aneurysm    Diabetes Paternal Grandmother    Heart disease Paternal Grandmother        ~ 20 y/o   Hypertension Paternal Grandmother    Melanoma Maternal Grandmother 28   Colon cancer Other    Leukemia Paternal Cousin 50   Breast cancer Neg Hx    Stomach cancer Neg Hx    Esophageal cancer Neg Hx    Pancreatic cancer Neg Hx    Rectal cancer Neg Hx     ALLERGIES:  is allergic to cephalexin, penicillins, flagyl [metronidazole], latex, prednisone, and ergocalciferol .  MEDICATIONS:  Current Outpatient Medications  Medication Sig Dispense Refill   acetaminophen  (TYLENOL ) 500 MG tablet Take 1,000 mg by mouth every 8 (eight) hours as needed for mild pain or headache.     CELEBREX  200 MG capsule Take 200 mg by mouth 2 (two) times daily. As needed     Cholecalciferol (VITAMIN D3) 25 MCG (1000 UT) CAPS Take 1 capsule (1,000 Units total) by mouth daily.     latanoprost (XALATAN) 0.005 % ophthalmic solution Place 1 drop into both eyes at bedtime.     losartan  (COZAAR ) 100 MG tablet Take 1 tablet (100 mg total) by mouth daily. 90 tablet 1   metoprolol  succinate (TOPROL -XL) 50 MG 24 hr tablet Take 1 tablet (50 mg total) by mouth daily. Take with or immediately following a meal 90 tablet 1   Multiple Vitamins-Minerals (CENTRUM SILVER 50+WOMEN PO) Take by mouth.     traMADol  (ULTRAM ) 50 MG tablet Take 1 tablet (50 mg total) by mouth every 6 (six) hours as needed for moderate pain (pain score 4-6) or severe pain (pain score 7-10). 15 tablet 0   No current facility-administered medications for this visit.     PHYSICAL EXAMINATION: ECOG PERFORMANCE STATUS: 0 - Asymptomatic  Vitals:   04/12/24 1037  BP: (!) 148/61  Pulse: 90  Resp: 18  Temp: (!) 97.4 F (36.3 C)  SpO2: 98%   Filed Weights   04/12/24 1037  Weight: 209 lb (94.8 kg)     GENERAL:alert, no distress and comfortable  LABORATORY DATA:  I have reviewed the data as listed Lab Results  Component Value Date   WBC 7.5 12/29/2023   HGB 14.3 12/29/2023   HCT 43.0 12/29/2023   MCV 85.0 12/29/2023   PLT 255 12/29/2023     Chemistry      Component Value Date/Time   NA 140 12/29/2023 1128   K 4.1 12/29/2023 1128   CL 105 12/29/2023 1128   CO2 29 12/29/2023  1128   BUN 11 12/29/2023 1128   CREATININE 0.86 12/29/2023 1128   CREATININE 0.99 05/15/2016 1650      Component Value Date/Time   CALCIUM 9.4 12/29/2023 1128   ALKPHOS 100 12/29/2023 1128   AST 22 12/29/2023 1128   ALT 27 12/29/2023 1128   BILITOT 0.7 12/29/2023 1128       RADIOGRAPHIC STUDIES: I have personally reviewed the radiological images as listed and agreed with the findings in the report. No results found.   All questions were answered. The patient knows to call the clinic with any problems, questions or concerns. I spent 30 minutes in the care of this patient including H and P, review of records, counseling and coordination of care.     Amber Stalls, MD 04/12/2024 10:56 AM

## 2024-04-18 ENCOUNTER — Ambulatory Visit
Admission: RE | Admit: 2024-04-18 | Discharge: 2024-04-18 | Disposition: A | Discharge status: Home / Self Care | Source: Ambulatory Visit | Attending: Radiation Oncology | Admitting: Radiation Oncology

## 2024-04-18 DIAGNOSIS — D0512 Intraductal carcinoma in situ of left breast: Secondary | ICD-10-CM | POA: Diagnosis present

## 2024-04-18 DIAGNOSIS — Z510 Encounter for antineoplastic radiation therapy: Secondary | ICD-10-CM | POA: Diagnosis present

## 2024-04-19 ENCOUNTER — Other Ambulatory Visit: Payer: Self-pay | Admitting: Internal Medicine

## 2024-04-20 DIAGNOSIS — Z510 Encounter for antineoplastic radiation therapy: Secondary | ICD-10-CM | POA: Diagnosis not present

## 2024-05-01 ENCOUNTER — Other Ambulatory Visit: Payer: Self-pay

## 2024-05-01 ENCOUNTER — Ambulatory Visit
Admission: RE | Admit: 2024-05-01 | Discharge: 2024-05-01 | Disposition: A | Source: Ambulatory Visit | Attending: Radiation Oncology | Admitting: Radiation Oncology

## 2024-05-01 DIAGNOSIS — Z510 Encounter for antineoplastic radiation therapy: Secondary | ICD-10-CM | POA: Diagnosis not present

## 2024-05-01 LAB — RAD ONC ARIA SESSION SUMMARY
Course Elapsed Days: 0
Plan Fractions Treated to Date: 1
Plan Prescribed Dose Per Fraction: 2.66 Gy
Plan Total Fractions Prescribed: 16
Plan Total Prescribed Dose: 42.56 Gy
Reference Point Dosage Given to Date: 2.66 Gy
Reference Point Session Dosage Given: 2.66 Gy
Session Number: 1

## 2024-05-02 ENCOUNTER — Ambulatory Visit
Admission: RE | Admit: 2024-05-02 | Discharge: 2024-05-02 | Disposition: A | Source: Ambulatory Visit | Attending: Radiation Oncology

## 2024-05-02 ENCOUNTER — Other Ambulatory Visit: Payer: Self-pay

## 2024-05-02 DIAGNOSIS — Z510 Encounter for antineoplastic radiation therapy: Secondary | ICD-10-CM | POA: Diagnosis not present

## 2024-05-02 LAB — RAD ONC ARIA SESSION SUMMARY
Course Elapsed Days: 1
Plan Fractions Treated to Date: 2
Plan Prescribed Dose Per Fraction: 2.66 Gy
Plan Total Fractions Prescribed: 16
Plan Total Prescribed Dose: 42.56 Gy
Reference Point Dosage Given to Date: 5.32 Gy
Reference Point Session Dosage Given: 2.66 Gy
Session Number: 2

## 2024-05-03 ENCOUNTER — Other Ambulatory Visit: Payer: Self-pay

## 2024-05-03 ENCOUNTER — Ambulatory Visit: Admitting: Gastroenterology

## 2024-05-03 ENCOUNTER — Ambulatory Visit
Admission: RE | Admit: 2024-05-03 | Discharge: 2024-05-03 | Disposition: A | Source: Ambulatory Visit | Attending: Radiation Oncology

## 2024-05-03 DIAGNOSIS — Z510 Encounter for antineoplastic radiation therapy: Secondary | ICD-10-CM | POA: Diagnosis not present

## 2024-05-03 LAB — RAD ONC ARIA SESSION SUMMARY
Course Elapsed Days: 2
Plan Fractions Treated to Date: 3
Plan Prescribed Dose Per Fraction: 2.66 Gy
Plan Total Fractions Prescribed: 16
Plan Total Prescribed Dose: 42.56 Gy
Reference Point Dosage Given to Date: 7.98 Gy
Reference Point Session Dosage Given: 2.66 Gy
Session Number: 3

## 2024-05-04 ENCOUNTER — Ambulatory Visit
Admission: RE | Admit: 2024-05-04 | Discharge: 2024-05-04 | Disposition: A | Discharge status: Home / Self Care | Source: Ambulatory Visit | Attending: Radiation Oncology | Admitting: Radiation Oncology

## 2024-05-04 ENCOUNTER — Other Ambulatory Visit: Payer: Self-pay

## 2024-05-04 DIAGNOSIS — Z510 Encounter for antineoplastic radiation therapy: Secondary | ICD-10-CM | POA: Diagnosis not present

## 2024-05-04 LAB — RAD ONC ARIA SESSION SUMMARY
Course Elapsed Days: 3
Plan Fractions Treated to Date: 4
Plan Prescribed Dose Per Fraction: 2.66 Gy
Plan Total Fractions Prescribed: 16
Plan Total Prescribed Dose: 42.56 Gy
Reference Point Dosage Given to Date: 10.64 Gy
Reference Point Session Dosage Given: 2.66 Gy
Session Number: 4

## 2024-05-05 ENCOUNTER — Ambulatory Visit
Admission: RE | Admit: 2024-05-05 | Discharge: 2024-05-05 | Disposition: A | Source: Ambulatory Visit | Attending: Radiation Oncology

## 2024-05-05 ENCOUNTER — Other Ambulatory Visit: Payer: Self-pay

## 2024-05-05 ENCOUNTER — Ambulatory Visit: Admission: RE | Admit: 2024-05-05 | Source: Ambulatory Visit

## 2024-05-05 DIAGNOSIS — Z510 Encounter for antineoplastic radiation therapy: Secondary | ICD-10-CM | POA: Diagnosis not present

## 2024-05-05 DIAGNOSIS — D0512 Intraductal carcinoma in situ of left breast: Secondary | ICD-10-CM

## 2024-05-05 LAB — RAD ONC ARIA SESSION SUMMARY
Course Elapsed Days: 4
Plan Fractions Treated to Date: 5
Plan Prescribed Dose Per Fraction: 2.66 Gy
Plan Total Fractions Prescribed: 16
Plan Total Prescribed Dose: 42.56 Gy
Reference Point Dosage Given to Date: 13.3 Gy
Reference Point Session Dosage Given: 2.66 Gy
Session Number: 5

## 2024-05-05 MED ORDER — RADIAPLEXRX EX GEL: Freq: Once | CUTANEOUS | Status: AC

## 2024-05-05 MED ORDER — ALRA NON-METALLIC DEODORANT (RAD-ONC)
1.0000 | Freq: Once | TOPICAL | Status: AC
  Administered 2024-05-05: 1 via TOPICAL

## 2024-05-08 ENCOUNTER — Ambulatory Visit

## 2024-05-09 ENCOUNTER — Ambulatory Visit

## 2024-05-09 DIAGNOSIS — Z510 Encounter for antineoplastic radiation therapy: Secondary | ICD-10-CM | POA: Diagnosis not present

## 2024-05-10 ENCOUNTER — Other Ambulatory Visit: Payer: Self-pay

## 2024-05-10 ENCOUNTER — Ambulatory Visit
Admission: RE | Admit: 2024-05-10 | Discharge: 2024-05-10 | Disposition: A | Source: Ambulatory Visit | Attending: Radiation Oncology

## 2024-05-10 LAB — RAD ONC ARIA SESSION SUMMARY
Course Elapsed Days: 9
Plan Fractions Treated to Date: 6
Plan Prescribed Dose Per Fraction: 2.66 Gy
Plan Total Fractions Prescribed: 16
Plan Total Prescribed Dose: 42.56 Gy
Reference Point Dosage Given to Date: 15.96 Gy
Reference Point Session Dosage Given: 2.66 Gy
Session Number: 6

## 2024-05-11 ENCOUNTER — Other Ambulatory Visit: Payer: Self-pay

## 2024-05-11 ENCOUNTER — Ambulatory Visit
Admission: RE | Admit: 2024-05-11 | Discharge: 2024-05-11 | Disposition: A | Discharge status: Home / Self Care | Source: Ambulatory Visit | Attending: Radiation Oncology | Admitting: Radiation Oncology

## 2024-05-11 LAB — RAD ONC ARIA SESSION SUMMARY
Course Elapsed Days: 10
Plan Fractions Treated to Date: 7
Plan Prescribed Dose Per Fraction: 2.66 Gy
Plan Total Fractions Prescribed: 16
Plan Total Prescribed Dose: 42.56 Gy
Reference Point Dosage Given to Date: 18.62 Gy
Reference Point Session Dosage Given: 2.66 Gy
Session Number: 7

## 2024-05-12 ENCOUNTER — Other Ambulatory Visit: Payer: Self-pay

## 2024-05-12 ENCOUNTER — Ambulatory Visit
Admission: RE | Admit: 2024-05-12 | Discharge: 2024-05-12 | Disposition: A | Source: Ambulatory Visit | Attending: Radiation Oncology

## 2024-05-12 LAB — RAD ONC ARIA SESSION SUMMARY
Course Elapsed Days: 11
Plan Fractions Treated to Date: 8
Plan Prescribed Dose Per Fraction: 2.66 Gy
Plan Total Fractions Prescribed: 16
Plan Total Prescribed Dose: 42.56 Gy
Reference Point Dosage Given to Date: 21.28 Gy
Reference Point Session Dosage Given: 2.66 Gy
Session Number: 8

## 2024-05-15 ENCOUNTER — Ambulatory Visit

## 2024-05-16 ENCOUNTER — Other Ambulatory Visit: Payer: Self-pay

## 2024-05-16 ENCOUNTER — Ambulatory Visit
Admission: RE | Admit: 2024-05-16 | Discharge: 2024-05-16 | Disposition: A | Source: Ambulatory Visit | Attending: Radiation Oncology

## 2024-05-16 LAB — RAD ONC ARIA SESSION SUMMARY
Course Elapsed Days: 15
Plan Fractions Treated to Date: 9
Plan Prescribed Dose Per Fraction: 2.66 Gy
Plan Total Fractions Prescribed: 16
Plan Total Prescribed Dose: 42.56 Gy
Reference Point Dosage Given to Date: 23.94 Gy
Reference Point Session Dosage Given: 2.66 Gy
Session Number: 9

## 2024-05-17 ENCOUNTER — Ambulatory Visit
Admission: RE | Admit: 2024-05-17 | Discharge: 2024-05-17 | Disposition: A | Source: Ambulatory Visit | Attending: Radiation Oncology

## 2024-05-17 ENCOUNTER — Other Ambulatory Visit: Payer: Self-pay

## 2024-05-17 LAB — RAD ONC ARIA SESSION SUMMARY
Course Elapsed Days: 16
Plan Fractions Treated to Date: 10
Plan Prescribed Dose Per Fraction: 2.66 Gy
Plan Total Fractions Prescribed: 16
Plan Total Prescribed Dose: 42.56 Gy
Reference Point Dosage Given to Date: 26.6 Gy
Reference Point Session Dosage Given: 2.66 Gy
Session Number: 10

## 2024-05-18 ENCOUNTER — Ambulatory Visit
Admission: RE | Admit: 2024-05-18 | Discharge: 2024-05-18 | Disposition: A | Source: Ambulatory Visit | Attending: Radiation Oncology

## 2024-05-18 ENCOUNTER — Other Ambulatory Visit: Payer: Self-pay

## 2024-05-18 LAB — RAD ONC ARIA SESSION SUMMARY
Course Elapsed Days: 17
Plan Fractions Treated to Date: 11
Plan Prescribed Dose Per Fraction: 2.66 Gy
Plan Total Fractions Prescribed: 16
Plan Total Prescribed Dose: 42.56 Gy
Reference Point Dosage Given to Date: 29.26 Gy
Reference Point Session Dosage Given: 2.66 Gy
Session Number: 11

## 2024-05-19 ENCOUNTER — Ambulatory Visit: Admitting: Radiation Oncology

## 2024-05-19 ENCOUNTER — Other Ambulatory Visit: Payer: Self-pay

## 2024-05-19 ENCOUNTER — Ambulatory Visit: Admission: RE | Admit: 2024-05-19

## 2024-05-19 ENCOUNTER — Ambulatory Visit: Admission: RE | Admit: 2024-05-19 | Source: Ambulatory Visit

## 2024-05-19 LAB — RAD ONC ARIA SESSION SUMMARY
Course Elapsed Days: 18
Plan Fractions Treated to Date: 12
Plan Prescribed Dose Per Fraction: 2.66 Gy
Plan Total Fractions Prescribed: 16
Plan Total Prescribed Dose: 42.56 Gy
Reference Point Dosage Given to Date: 31.92 Gy
Reference Point Session Dosage Given: 2.66 Gy
Session Number: 12

## 2024-05-22 ENCOUNTER — Ambulatory Visit

## 2024-05-23 ENCOUNTER — Ambulatory Visit

## 2024-05-24 ENCOUNTER — Ambulatory Visit

## 2024-05-25 ENCOUNTER — Ambulatory Visit

## 2024-05-26 ENCOUNTER — Ambulatory Visit

## 2024-05-29 ENCOUNTER — Ambulatory Visit

## 2024-05-30 ENCOUNTER — Ambulatory Visit

## 2024-05-31 ENCOUNTER — Ambulatory Visit

## 2024-06-05 ENCOUNTER — Ambulatory Visit: Admitting: Internal Medicine

## 2024-06-29 ENCOUNTER — Inpatient Hospital Stay

## 2024-06-29 ENCOUNTER — Inpatient Hospital Stay: Admitting: Adult Health

## 2024-09-12 ENCOUNTER — Inpatient Hospital Stay: Admitting: Adult Health
